# Patient Record
Sex: Female | Born: 1970 | Race: White | Hispanic: Yes | Marital: Married | Smoking: Never smoker
Health system: Southern US, Community
[De-identification: ages and names within clinical notes are randomized; demographics above are authoritative.]

## PROBLEM LIST (undated history)

## (undated) DIAGNOSIS — Z87898 Personal history of other specified conditions: Secondary | ICD-10-CM

## (undated) DIAGNOSIS — Z8742 Personal history of other diseases of the female genital tract: Secondary | ICD-10-CM

## (undated) DIAGNOSIS — B009 Herpesviral infection, unspecified: Secondary | ICD-10-CM

## (undated) HISTORY — DX: Personal history of other diseases of the female genital tract: Z87.42

## (undated) HISTORY — DX: Herpesviral infection, unspecified: B00.9

## (undated) HISTORY — DX: Personal history of other specified conditions: Z87.898

## (undated) HISTORY — PX: WISDOM TOOTH EXTRACTION: SHX21

---

## 2013-11-22 ENCOUNTER — Other Ambulatory Visit (HOSPITAL_COMMUNITY)
Admission: RE | Admit: 2013-11-22 | Discharge: 2013-11-22 | Disposition: A | Discharge status: Home / Self Care | Payer: 59 | Source: Ambulatory Visit | Attending: Family Medicine | Admitting: Family Medicine

## 2013-11-22 DIAGNOSIS — Z1151 Encounter for screening for human papillomavirus (HPV): Secondary | ICD-10-CM | POA: Diagnosis present

## 2013-11-22 DIAGNOSIS — Z124 Encounter for screening for malignant neoplasm of cervix: Secondary | ICD-10-CM | POA: Diagnosis present

## 2013-11-27 ENCOUNTER — Other Ambulatory Visit: Payer: Self-pay

## 2013-11-27 DIAGNOSIS — Z1231 Encounter for screening mammogram for malignant neoplasm of breast: Secondary | ICD-10-CM

## 2013-12-11 ENCOUNTER — Ambulatory Visit: Payer: 59

## 2013-12-12 ENCOUNTER — Encounter (INDEPENDENT_AMBULATORY_CARE_PROVIDER_SITE_OTHER): Payer: Self-pay

## 2013-12-12 ENCOUNTER — Ambulatory Visit
Admission: RE | Admit: 2013-12-12 | Discharge: 2013-12-12 | Disposition: A | Discharge status: Home / Self Care | Payer: 59 | Source: Ambulatory Visit

## 2013-12-12 DIAGNOSIS — Z1231 Encounter for screening mammogram for malignant neoplasm of breast: Secondary | ICD-10-CM

## 2013-12-19 ENCOUNTER — Encounter: Payer: Self-pay | Admitting: Obstetrics and Gynecology

## 2014-02-14 ENCOUNTER — Encounter: Payer: 59 | Admitting: Obstetrics and Gynecology

## 2014-02-15 ENCOUNTER — Encounter: Payer: 59 | Admitting: Obstetrics and Gynecology

## 2014-02-16 ENCOUNTER — Encounter: Payer: Self-pay | Admitting: Obstetrics and Gynecology

## 2014-02-16 ENCOUNTER — Ambulatory Visit (INDEPENDENT_AMBULATORY_CARE_PROVIDER_SITE_OTHER): Payer: 59 | Admitting: Obstetrics and Gynecology

## 2014-02-16 VITALS — BP 110/60 | HR 64 | Resp 18 | Ht 63.25 in | Wt 141.0 lb

## 2014-02-16 DIAGNOSIS — N760 Acute vaginitis: Secondary | ICD-10-CM

## 2014-02-16 DIAGNOSIS — Z0000 Encounter for general adult medical examination without abnormal findings: Secondary | ICD-10-CM

## 2014-02-16 LAB — POCT URINALYSIS DIPSTICK
Bilirubin, UA: NEGATIVE
Blood, UA: NEGATIVE
Glucose, UA: NEGATIVE
KETONES UA: NEGATIVE
Leukocytes, UA: NEGATIVE
Nitrite, UA: NEGATIVE
PH UA: 5
PROTEIN UA: NEGATIVE
UROBILINOGEN UA: NEGATIVE

## 2014-02-16 NOTE — Progress Notes (Signed)
43 y.o. Christine Crawford MarriedCaucasianF here for problem visit.   Patient is from [place]  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  Usually no discharge or pain.  Has itching which is progressive.  Cracks in the skin.  No odor.  Comes and goes.  Usually treated as yeast infection.  Frequently took Fluconazole but stopped due to concerns about potential liver side effects.  Encouraged to use Monistat instead.  3 day tx helps.  Used last time 2 months ago.  Husband also perceives some itching when patient has yeast infections and they have intercourse.   No new partners.   Uses Dove soap.  No douching.  Uses pads only during menses.  Does not use every day.  Uses antiallergic form.  History of oral HSV and uses Famvir.  No other medications.   Has had glucose checked and is at the limit of normal.   No regular exercise program.   Probiotics did not work.   Patient's last menstrual period was 01/29/2014.          Sexually active: Yes.    The current method of family planning is vasectomy.    Exercising: No.  The patient does not participate in regular exercise at present. Smoker:  no  Health Maintenance: Pap: 11/2013 neg - Per pt History of abnormal Pap:  no MMG:  12/12/13 BIRADS1: neg Colonoscopy:  12/2013 normal BMD:   Never TDaP:  Current - per pt Screening Labs: PCP, Hb today: PCP, Urine today:     reports that she has never smoked. She has never used smokeless tobacco. She reports that she drinks about 0.6 oz of alcohol per week. She reports that she does not use illicit drugs.  Past Medical History  Diagnosis Date  . History of vaginal infection     Recurrents     Past Surgical History  Procedure Laterality Date  . Wisdom tooth extraction      No current outpatient prescriptions on file.   No current facility-administered medications for this visit.    Family History  Problem Relation Age of Onset  .  Cancer Mother     Colon  . Heart disease Father   . Breast cancer Maternal Aunt   . Breast cancer Paternal Aunt     ROS:  Pertinent items are noted in HPI.  Otherwise, a comprehensive ROS was negative.  Exam:   BP 110/60 mmHg  Pulse 64  Resp 18  Ht 5' 3.25" (1.607 m)  Wt 141 lb (63.957 kg)  BMI 24.77 kg/m2  LMP 01/29/2014     Height: 5' 3.25" (160.7 cm)  Ht Readings from Last 3 Encounters:  02/16/14 5' 3.25" (1.607 m)    General appearance: alert, cooperative and appears stated age Abdomen: soft, non-tender; bowel sounds normal; no masses,  no organomegaly No abnormal inguinal nodes palpated Neurologic: Grossly normal  Pelvic: External genitalia:  no lesions              Urethra:  normal appearing urethra with no masses, tenderness or lesions              Bartholins and Skenes: normal                 Vagina: normal appearing vagina with normal color and discharge, no lesions              Cervix: no lesions  Pap taken: No. Bimanual Exam:  Uterus:  normal size, contour, position, consistency, mobility, non-tender              Adnexa: normal adnexa and no mass, fullness, tenderness              Assessment:     History of recurrent vaginitis.  Asymptomatic today.   Plan:    Comprehensive discussion regarding vaginitis.  Return to office for symptoms.  Discussed potential boric acid vaginal suppositories for recurrent infection.  Yeast culture may be helpful in future.  Continue probiotics and good hygiene practices with avoiding irritants and harsh soaps. Avoid restrictive clothing.   45 minutes face to face time of which over 50% was spent in counseling.   An After Visit Summary was printed and given to the patient.

## 2014-02-16 NOTE — Patient Instructions (Signed)
Please research Boric acid suppositories for the vagina.  This may be a future option for you!

## 2014-05-03 ENCOUNTER — Telehealth: Payer: Self-pay | Admitting: Obstetrics and Gynecology

## 2014-05-03 NOTE — Telephone Encounter (Signed)
Spoke with patient. Patient states that she has recurrent "vaginal infections." States she is having vaginal itching and discomfort. Requesting to be seen for evaluation. Appointment scheduled for tomorrow at 10:15am with Lauro FranklinPatricia Rolen-Grubb, FNP. Agreeable to date and time.  Routing to provider for final review. Patient agreeable to disposition. Will close encounter

## 2014-05-03 NOTE — Telephone Encounter (Signed)
Pt states she has a bacterial infection and would like to see Dr Edward JollySilva. Pt informed Dr Edward JollySilva is not in the office, pt agreeable to see NP.

## 2014-05-04 ENCOUNTER — Ambulatory Visit: Payer: Self-pay | Admitting: Nurse Practitioner

## 2014-05-07 ENCOUNTER — Encounter: Payer: Self-pay | Admitting: Certified Nurse Midwife

## 2014-05-07 ENCOUNTER — Ambulatory Visit (INDEPENDENT_AMBULATORY_CARE_PROVIDER_SITE_OTHER): Payer: 59 | Admitting: Certified Nurse Midwife

## 2014-05-07 VITALS — BP 110/60 | HR 70 | Resp 16 | Ht 63.25 in | Wt 146.0 lb

## 2014-05-07 DIAGNOSIS — N762 Acute vulvitis: Secondary | ICD-10-CM

## 2014-05-07 MED ORDER — NYSTATIN-TRIAMCINOLONE 100000-0.1 UNIT/GM-% EX CREA: 1.0000 "application " | TOPICAL_CREAM | Freq: Two times a day (BID) | CUTANEOUS | Status: DC

## 2014-05-07 NOTE — Patient Instructions (Signed)
Monilial Vaginitis Vaginitis in a soreness, swelling and redness (inflammation) of the vagina and vulva. Monilial vaginitis is not a sexually transmitted infection. CAUSES  Yeast vaginitis is caused by yeast (candida) that is normally found in your vagina. With a yeast infection, the candida has overgrown in number to a point that upsets the chemical balance. SYMPTOMS   White, thick vaginal discharge.  Swelling, itching, redness and irritation of the vagina and possibly the lips of the vagina (vulva).  Burning or painful urination.  Painful intercourse. DIAGNOSIS  Things that may contribute to monilial vaginitis are:  Postmenopausal and virginal states.  Pregnancy.  Infections.  Being tired, sick or stressed, especially if you had monilial vaginitis in the past.  Diabetes. Good control will help lower the chance.  Birth control pills.  Tight fitting garments.  Using bubble bath, feminine sprays, douches or deodorant tampons.  Taking certain medications that kill germs (antibiotics).  Sporadic recurrence can occur if you become ill. TREATMENT  Your caregiver will give you medication.  There are several kinds of anti monilial vaginal creams and suppositories specific for monilial vaginitis. For recurrent yeast infections, use a suppository or cream in the vagina 2 times a week, or as directed.  Anti-monilial or steroid cream for the itching or irritation of the vulva may also be used. Get your caregiver's permission.  Painting the vagina with methylene blue solution may help if the monilial cream does not work.  Eating yogurt may help prevent monilial vaginitis. HOME CARE INSTRUCTIONS   Finish all medication as prescribed.  Do not have sex until treatment is completed or after your caregiver tells you it is okay.  Take warm sitz baths.  Do not douche.  Do not use tampons, especially scented ones.  Wear cotton underwear.  Avoid tight pants and panty  hose.  Tell your sexual partner that you have a yeast infection. They should go to their caregiver if they have symptoms such as mild rash or itching.  Your sexual partner should be treated as well if your infection is difficult to eliminate.  Practice safer sex. Use condoms.  Some vaginal medications cause latex condoms to fail. Vaginal medications that harm condoms are:  Cleocin cream.  Butoconazole (Femstat).  Terconazole (Terazol) vaginal suppository.  Miconazole (Monistat) (may be purchased over the counter). SEEK MEDICAL CARE IF:   You have a temperature by mouth above 102 F (38.9 C).  The infection is getting worse after 2 days of treatment.  The infection is not getting better after 3 days of treatment.  You develop blisters in or around your vagina.  You develop vaginal bleeding, and it is not your menstrual period.  You have pain when you urinate.  You develop intestinal problems.  You have pain with sexual intercourse. Document Released: 01/07/2005 Document Revised: 06/22/2011 Document Reviewed: 09/21/2008 [facility] Patient Information 2015 [place], [place]. This information is not intended to replace advice given to you by your health care provider. Make sure you discuss any questions you have with your health care provider. Candida Infection A Candida infection (also called yeast, fungus, and Monilia infection) is an overgrowth of yeast that can occur anywhere on the body. A yeast infection commonly occurs in warm, moist body areas. Usually, the infection remains localized but can spread to become a systemic infection. A yeast infection may be a sign of a more severe disease such as diabetes, leukemia, or AIDS. A yeast infection can occur in both men and women. In women, Candida vaginitis  is a vaginal infection. It is one of the most common causes of vaginitis. Men usually do not have symptoms or know they have an infection until other problems develop. Men may  find out they have a yeast infection because their sex partner has a yeast infection. Uncircumcised men are more likely to get a yeast infection than circumcised men. This is because the uncircumcised glans is not exposed to air and does not remain as dry as that of a circumcised glans. Older adults may develop yeast infections around dentures. CAUSES  Women  Antibiotics.  Steroid medication taken for a long time.  Being overweight (obese).  Diabetes.  Poor immune condition.  Certain serious medical conditions.  Immune suppressive medications for organ transplant patients.  Chemotherapy.  Pregnancy.  Menstruation.  Stress and fatigue.  Intravenous drug use.  Oral contraceptives.  Wearing tight-fitting clothes in the crotch area.  Catching it from a sex partner who has a yeast infection.  Spermicide.  Intravenous, urinary, or other catheters. Men  Catching it from a sex partner who has a yeast infection.  Having oral or anal sex with a person who has the infection.  Spermicide.  Diabetes.  Antibiotics.  Poor immune system.  Medications that suppress the immune system.  Intravenous drug use.  Intravenous, urinary, or other catheters. SYMPTOMS  Women  Thick, white vaginal discharge.  Vaginal itching.  Redness and swelling in and around the vagina.  Irritation of the lips of the vagina and perineum.  Blisters on the vaginal lips and perineum.  Painful sexual intercourse.  Low blood sugar (hypoglycemia).  Painful urination.  Bladder infections.  Intestinal problems such as constipation, indigestion, bad breath, bloating, increase in gas, diarrhea, or loose stools. Men  Men may develop intestinal problems such as constipation, indigestion, bad breath, bloating, increase in gas, diarrhea, or loose stools.  Dry, cracked skin on the penis with itching or discomfort.  Jock itch.  Dry, flaky skin.  Athlete's  foot.  Hypoglycemia. DIAGNOSIS  Women  A history and an exam are performed.  The discharge may be examined under a microscope.  A culture may be taken of the discharge. Men  A history and an exam are performed.  Any discharge from the penis or areas of cracked skin will be looked at under the microscope and cultured.  Stool samples may be cultured. TREATMENT  Women  Vaginal antifungal suppositories and creams.  Medicated creams to decrease irritation and itching on the outside of the vagina.  Warm compresses to the perineal area to decrease swelling and discomfort.  Oral antifungal medications.  Medicated vaginal suppositories or cream for repeated or recurrent infections.  Wash and dry the irritation areas before applying the cream.  Eating yogurt with Lactobacillus may help with prevention and treatment.  Sometimes painting the vagina with gentian violet solution may help if creams and suppositories do not work. Men  Antifungal creams and oral antifungal medications.  Sometimes treatment must continue for 30 days after the symptoms go away to prevent recurrence. HOME CARE INSTRUCTIONS  Women  Use cotton underwear and avoid tight-fitting clothing.  Avoid colored, scented toilet paper and deodorant tampons or pads.  Do not douche.  Keep your diabetes under control.  Finish all the prescribed medications.  Keep your skin clean and dry.  Consume milk or yogurt with Lactobacillus-active culture regularly. If you get frequent yeast infections and think that is what the infection is, there are over-the-counter medications that you can get. If the infection  does not show healing in 3 days, talk to your caregiver.  Tell your sex partner you have a yeast infection. Your partner may need treatment also, especially if your infection does not clear up or recurs. Men  Keep your skin clean and dry.  Keep your diabetes under control.  Finish all prescribed  medications.  Tell your sex partner that you have a yeast infection so he or she can be treated if necessary. SEEK MEDICAL CARE IF:   Your symptoms do not clear up or worsen in one week after treatment.  You have an oral temperature above 102 F (38.9 C).  You have trouble swallowing or eating for a prolonged time.  You develop blisters on and around your vagina.  You develop vaginal bleeding and it is not your menstrual period.  You develop abdominal pain.  You develop intestinal problems as mentioned above.  You get weak or light-headed.  You have painful or increased urination.  You have pain during sexual intercourse. MAKE SURE YOU:   Understand these instructions.  Will watch your condition.  Will get help right away if you are not doing well or get worse. Document Released: 05/07/2004 Document Revised: 08/14/2013 Document Reviewed: 08/19/2009 [facility] Patient Information 2015 [place], [place]. This information is not intended to replace advice given to you by your health care provider. Make sure you discuss any questions you have with your health care provider.

## 2014-05-07 NOTE — Progress Notes (Signed)
44 y.o. married white female([place] origin) [id number] here with complaint of vaginal symptoms of itching, burning, external only of vulva area.  Describes discharge as scant, menses starting.. Onset of symptoms 30 days ago. Denies new personal products or vaginal dryness. Patient treated self with Monistat 3 and Monistat derm as directed about one month ago. Felt better, but did not go completely away. Partner having symptoms also and used some of her cream once or twice. Spouse has never really treated himself consistent and when he has symptoms she does also. Patient has dealing with this off and on for about one year. No STD concerns. Urinary symptoms none .   O:Healthy female WDWN Affect: normal, orientation x 3  Exam: Abdomen:soft. Lymph node: no enlargement or tenderness Pelvic exam: External genital: normal female with redness on lower half of vulva area and at fourchette of vagina. Slight scaling and cracking noted, with white exudate. Tender to touch wet prep taken BUS: negative Vagina: scant brown discharge noted. Ph: not done   ,Wet prep not taken. Affirm collected. 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 Discussed Aveeno or baking soda sitz bath for comfort. Avoid moist clothes or pads for extended period of time.  Patient will have Spouse treat himself with OTC or see his PCP. Encouraged to avoid sexual activity until she completes her medication. Will advise of results of Affirm when in. Discussed consider Boric acid suppositories if felt to be chronic, but feel that they are passing this back/forth and may not be chronic just unresolved. Patient also feels this may be the case. Questions addressed. Rx: Mycolog cream see order with  instructions.  Rv prn

## 2014-05-08 ENCOUNTER — Telehealth: Payer: Self-pay

## 2014-05-08 LAB — WET PREP BY MOLECULAR PROBE
CANDIDA SPECIES: POSITIVE — AB
GARDNERELLA VAGINALIS: NEGATIVE
Trichomonas vaginosis: NEGATIVE

## 2014-05-08 MED ORDER — TERCONAZOLE 0.4 % VA CREA: 1.0000 | TOPICAL_CREAM | Freq: Every day | VAGINAL | Status: DC

## 2014-05-08 NOTE — Telephone Encounter (Signed)
Patient notified of results as written by provider 

## 2014-05-08 NOTE — Telephone Encounter (Signed)
lmtcb

## 2014-05-08 NOTE — Telephone Encounter (Signed)
-----   Message from Verner Choleborah S Leonard, CNM sent at 05/08/2014  8:17 AM EST ----- Notify affirm showed positive yeast only, needs Rx Terazol 7 every hs x 7 as we discussed. Order in Let her know it takes a week to treat and another week to rebalance the ph in vagina and symptoms to resolve, Rv prn

## 2014-05-08 NOTE — Telephone Encounter (Signed)
Patient returned call

## 2014-05-08 NOTE — Progress Notes (Signed)
Reviewed personally.  M. Suzanne Clayden Withem, MD.  

## 2014-08-07 ENCOUNTER — Telehealth: Payer: Self-pay | Admitting: Obstetrics and Gynecology

## 2014-08-07 NOTE — Telephone Encounter (Signed)
Pt has a vaginal infection.

## 2014-08-07 NOTE — Telephone Encounter (Signed)
Spoke with patient. Patient states that she has been experiencing vaginal itching and slight discharge for one week. "I get these infections over and over. I told Dr.Silva about this when I came in to see her but when I had another one she was out of town and I was seen by a different provider." Patient requesting to be seen with Dr.Silva for recurring symptoms. Appointment scheduled for tomorrow at 11:30am. Patient is agreeable to date and time.  Routing to provider for final review. Patient agreeable to disposition. Will close encounter

## 2014-08-08 ENCOUNTER — Encounter: Payer: Self-pay | Admitting: Obstetrics and Gynecology

## 2014-08-08 ENCOUNTER — Ambulatory Visit (INDEPENDENT_AMBULATORY_CARE_PROVIDER_SITE_OTHER): Payer: 59 | Admitting: Obstetrics and Gynecology

## 2014-08-08 ENCOUNTER — Telehealth: Payer: Self-pay | Admitting: Obstetrics and Gynecology

## 2014-08-08 VITALS — BP 98/60 | HR 70 | Ht 63.25 in | Wt 146.2 lb

## 2014-08-08 DIAGNOSIS — N760 Acute vaginitis: Secondary | ICD-10-CM | POA: Diagnosis not present

## 2014-08-08 NOTE — Progress Notes (Signed)
Patient ID: Christine Crawford, female   DOB: [birth year], 44 y.o.   MRN: [id number] GYNECOLOGY  VISIT   HPI: 44 y.o.   Married  [place]  female   [phone] with Patient's last menstrual period was 07/23/2014 (approximate).   here for evaluation of vaginal itching and discharge.  Symptoms for 3 weeks.  Frustrated with recurrent symptoms.  Denies odor.  Has used some Terazol externally but not internally.  No recent antibiotics.   Has recurrent vaginitis.  Frequently yeast based.  Has used Fluconazole in the past but worried about long term use.   Saw PCP last year and glucose was slightly elevated.  Due for a revisit.   GYNECOLOGIC HISTORY: Patient's last menstrual period was 07/23/2014 (approximate). Contraception: Vasectomy Menopausal hormone therapy: n/a Last pap: 11/2013 wnl - per patient Last mammogram:  12-12-13 fibroglandular density/nl:[facility]        OB History    Gravida Para Term Preterm AB TAB SAB Ectopic Multiple Living   2 2 2                 There are no active problems to display for this patient.   Past Medical History  Diagnosis Date  . History of vaginal infection     Recurrents     Past Surgical History  Procedure Laterality Date  . Wisdom tooth extraction      Current Outpatient Prescriptions  Medication Sig Dispense Refill  . nystatin-triamcinolone (MYCOLOG II) cream Apply 1 application topically 2 (two) times daily. Apply to affected area BID for up to 7 days. (Patient not taking: Reported on 08/08/2014) 30 g 0   No current facility-administered medications for this visit.     ALLERGIES: Review of patient's allergies indicates no known allergies.  Family History  Problem Relation Age of Onset  . Cancer Mother     Colon  . Heart disease Father   . Breast cancer Maternal Aunt   . Breast cancer Paternal Aunt     History   Social History  . Marital Status: Married    Spouse Name: N/A  . Number of Children: N/A  . Years of Education:  N/A   Occupational History  . Not on file.   Social History Main Topics  . Smoking status: Never Smoker   . Smokeless tobacco: Never Used  . Alcohol Use: 0.6 oz/week    1 Glasses of wine per week  . Drug Use: No  . Sexual Activity:    Partners: Male     Comment: husband's vasectomy   Other Topics Concern  . Not on file   Social History Narrative    ROS:  Pertinent items are noted in HPI.  PHYSICAL EXAMINATION:    BP 98/60 mmHg  Pulse 70  Ht 5' 3.25" (1.607 m)  Wt 146 lb 3.2 oz (66.316 kg)  BMI 25.68 kg/m2  LMP 07/23/2014 (Approximate)     General appearance: alert, cooperative and appears stated age  Pelvic: External genitalia:  no lesions              Urethra:  normal appearing urethra with no masses, tenderness or lesions              Bartholins and Skenes: normal                 Vagina: normal appearing vagina with normal color and moderate white discharge, no lesions              Cervix: normal  appearance                   Bimanual Exam:  Uterus:  uterus is normal size, shape, consistency and nontender                                      Adnexa: normal adnexa in size, nontender and no masses                                       ASSESSMENT  Recurrent vaginitis.  Elevated glucose level per patient.    PLAN  Affirm taken.  Will return for fasting labs - BMP (glucose) and HgbA1C.  Discussed Hylafem and Boric acid suppositories.  Discussed atrophy of vagina that can also mimic some vaginitis symptoms.  Treatment to await results.  Return prn.   An After Visit Summary was printed and given to the patient.  __15____ minutes face to face time of which over 50% was spent in counseling.

## 2014-08-08 NOTE — Telephone Encounter (Signed)
Spoke with patient. Patient states she is unable to have lab work done before Monday. Patient is scheduled for 5/2 at 8:30am to have glucose and BMP. "I had a culture done today and I am worried I will not hear back now until Tuesday and we talking about boric acid or hylafem. Can I get this before the weekend without having my labs until Monday?" Advised patient culture should return before the weekend and Dr.Silva will be able to make further recommendations based upon results before the weekend for relief. Then after lab work is performed on Monday will be contacted regarding those results and further recommendations. Patient is agreeable.   Routing to provider for final review. Patient agreeable to disposition. Will close encounter

## 2014-08-08 NOTE — Telephone Encounter (Signed)
Patient forgot to ask Dr.Silva a question at her appointment today. No further details given.

## 2014-08-09 LAB — WET PREP BY MOLECULAR PROBE
Candida species: POSITIVE — AB
GARDNERELLA VAGINALIS: NEGATIVE
TRICHOMONAS VAG: NEGATIVE

## 2014-08-10 ENCOUNTER — Telehealth: Payer: Self-pay

## 2014-08-10 MED ORDER — TERCONAZOLE 0.4 % VA CREA: 1.0000 | TOPICAL_CREAM | Freq: Every day | VAGINAL | Status: DC

## 2014-08-10 MED ORDER — HYLAFEM VA SUPP: 1.0000 | Freq: Every day | VAGINAL | Status: DC

## 2014-08-10 MED ORDER — HYLAFEM VA SUPP
1.0000 | Freq: Every day | VAGINAL | Status: DC
Start: 2014-08-10 — End: 2016-02-07

## 2014-08-10 NOTE — Telephone Encounter (Signed)
Left message to call Christine Crawford at [phone]. 

## 2014-08-10 NOTE — Telephone Encounter (Signed)
-----   Message from Patton SallesBrook E Amundson C Silva, MD sent at 08/09/2014 10:45 AM EDT ----- Please inform patient of test results showing Candida.  This is a chronic problem for the patient.  I recommend that she do an acute treatment with Terazol 7 per vagina at this time.  Please send to her pharmacy of choice.  I will also prescribe the Hylafem 6 night treatment for the patient to have on hand.   Order this through [facility].  They will apparently have to order this as it is not always on the shelf. Treatment can be for only 3 nights in the future.   Patient is having her blood sugar checked here this week as she told me at there visit that her PCP said that her glucose was high.  A diagnosis of prediabetes or diabetes may be an explanation for the recurrence of these infections for her.   I would recommend that her partner do a treatment for yeast as well if he is having any itching or burning of the genital area. Sometimes couples share the yeast and pass it back and forth.   Cc- Claudette LawsAmanda Dixon

## 2014-08-10 NOTE — Telephone Encounter (Signed)
Spoke with patient. Advised of results and message as seen below from Dr.Silva. Patient is agreeable and verbalizes understanding. Rx for Terazol 7 0RF and Hylafem #6 0 RF [facility]. Patient is agreeable.  Routing to provider for final review. Patient agreeable to disposition. Will close encounter

## 2014-08-10 NOTE — Telephone Encounter (Signed)
Returning call.

## 2014-08-13 ENCOUNTER — Other Ambulatory Visit (INDEPENDENT_AMBULATORY_CARE_PROVIDER_SITE_OTHER): Payer: 59

## 2014-08-13 DIAGNOSIS — N760 Acute vaginitis: Secondary | ICD-10-CM

## 2014-08-13 LAB — BASIC METABOLIC PANEL
BUN: 17 mg/dL (ref 6–23)
CALCIUM: 9 mg/dL (ref 8.4–10.5)
CHLORIDE: 109 meq/L (ref 96–112)
CO2: 27 meq/L (ref 19–32)
CREATININE: 0.84 mg/dL (ref 0.50–1.10)
GLUCOSE: 92 mg/dL (ref 70–99)
POTASSIUM: 4.6 meq/L (ref 3.5–5.3)
SODIUM: 140 meq/L (ref 135–145)

## 2014-08-13 LAB — HEMOGLOBIN A1C
HEMOGLOBIN A1C: 5.5 % (ref <5.7)
Mean Plasma Glucose: 111 mg/dL (ref <117)

## 2014-08-14 ENCOUNTER — Telehealth: Payer: Self-pay | Admitting: *Deleted

## 2014-08-14 NOTE — Telephone Encounter (Signed)
Returning call.

## 2014-08-14 NOTE — Telephone Encounter (Signed)
Patient notified see result note 

## 2014-08-14 NOTE — Telephone Encounter (Signed)
Left Message To Call Back  

## 2014-08-14 NOTE — Telephone Encounter (Signed)
-----   Message from Patton SallesBrook E Amundson C Silva, MD sent at 08/14/2014  1:01 PM EDT ----- Please report normal blood chemistries, glucose, and hemoglobin A1C to patient.  She is not diabetic or prediabetic.   I hope the Hylafem has worked well for her.  It is nice in that it is a 3 - 6 day treatment option.   If the patient is preferring to return to periodic Diflucan, I can prescribe for her to do this one pill Diflucan 150 mg monthly.  I know she has had some concerns about the long term use of this.   Cc- Claudette LawsAmanda Dixon

## 2014-12-12 ENCOUNTER — Telehealth: Payer: Self-pay | Admitting: Obstetrics and Gynecology

## 2014-12-12 NOTE — Telephone Encounter (Signed)
I would have her consider over the counter antifungal treatment with Monistat or Gynelotrimin.  I would do a 3 day intravaginal treatment.  Avoid wet bathing suits, work out clothes, etc.

## 2014-12-12 NOTE — Telephone Encounter (Signed)
Patient would like to speak with nurse and give an update after taking medication.

## 2014-12-12 NOTE — Telephone Encounter (Signed)
Spoke with patient. Patient was last seen on 08/08/2014 with Dr.Silva for recurrent vaginitis. Patient was given Hylafem #6, Terzol 7, and Mycolog II. Patient's wet prep returned showing yeast at that appointment. Was advised to use Mycolog II and Terazol 7 and save Hylafem for future symptoms. Patient states that she began to have vaginal burning and itching with discharge last week. Patient used Hylafem 8/26-8/28. Patient states she is still experiencing the same symptoms and does not feel any better. Patient is asking if it is okay for her to use 3 more days of the Hylafem or if she needs something else for treatment. Advised I will speak with Dr.Silva and return call with further recommendations.

## 2014-12-12 NOTE — Telephone Encounter (Signed)
Spoke with patient. Advised of message as seen below from Dr.Silva. Patient is agreeable and verbalizes understanding. Will return call if symptoms persist after treatment.  Routing to provider for final review. Patient agreeable to disposition. Will close encounter.

## 2015-09-17 ENCOUNTER — Other Ambulatory Visit: Payer: Self-pay | Admitting: Family Medicine

## 2015-09-17 ENCOUNTER — Other Ambulatory Visit: Payer: Self-pay

## 2015-09-17 DIAGNOSIS — Z1231 Encounter for screening mammogram for malignant neoplasm of breast: Secondary | ICD-10-CM

## 2015-10-01 ENCOUNTER — Ambulatory Visit
Admission: RE | Admit: 2015-10-01 | Discharge: 2015-10-01 | Disposition: A | Discharge status: Home / Self Care | Payer: 59 | Source: Ambulatory Visit | Attending: Family Medicine | Admitting: Family Medicine

## 2015-10-01 DIAGNOSIS — Z1231 Encounter for screening mammogram for malignant neoplasm of breast: Secondary | ICD-10-CM

## 2016-02-05 ENCOUNTER — Telehealth: Payer: Self-pay | Admitting: Obstetrics and Gynecology

## 2016-02-05 NOTE — Telephone Encounter (Signed)
Spoke with patient at time of incoming call. Patient states that she has recurring vaginal yeast infections and was advised by Dr.Silva she may use OTC Monistat for treatment. Reports she had vaginal itching and discharge last month. Used OTC Monistat 3 and had slight burning. Symptom relief after completing 3 day course. Yesterday she began to have vaginal itching and discharge. Used 1 dose of Monistat 3 and began to have moderate vaginal burning. Had to try to flush medication out of vaginal area. Today she is having slight burning, itching, and discharge. Advised she will need to be seen in the office for further evaluation. Offered appointment for tomorrow with Dr.Silva, but patient declines. Appointment scheduled for 02/07/2016 at 1 pm with Dr.Silva. Patient is agreeable to date and time. Aware if she develops any new symptoms or symptoms worsen she will need to be seen earlier with our office or urgent care for evaluation. Patient is agreeable.  Routing to provider for final review. Patient agreeable to disposition. Will close encounter.

## 2016-02-07 ENCOUNTER — Ambulatory Visit (INDEPENDENT_AMBULATORY_CARE_PROVIDER_SITE_OTHER): Payer: 59 | Admitting: Obstetrics and Gynecology

## 2016-02-07 ENCOUNTER — Encounter: Payer: Self-pay | Admitting: Obstetrics and Gynecology

## 2016-02-07 VITALS — BP 110/70 | HR 64 | Ht 63.25 in

## 2016-02-07 DIAGNOSIS — N760 Acute vaginitis: Secondary | ICD-10-CM

## 2016-02-07 MED ORDER — NYSTATIN-TRIAMCINOLONE 100000-0.1 UNIT/GM-% EX CREA: 1.0000 "application " | TOPICAL_CREAM | Freq: Two times a day (BID) | CUTANEOUS | 0 refills | Status: DC

## 2016-02-07 MED ORDER — FLUCONAZOLE 150 MG PO TABS: 150.0000 mg | ORAL_TABLET | Freq: Once | ORAL | 2 refills | Status: AC

## 2016-02-07 NOTE — Progress Notes (Signed)
GYNECOLOGY  VISIT   HPI: 45 y.o.   Married  Caucasian/Brazilian female   G2P2000 with Patient's last menstrual period was 01/12/2016 (approximate).   here for vaginal itching. Used Monistat recently and it worsened symptoms. She developed extreme vaginal burning.    Initial symptoms are itching on the vulva.  Not really having discharge.   Used Monistat for 2 days last month. Caused burning immediately.   5 days ago had recurrent itching and burning.  Used Monistat again, and the burning recurred.  Has recurrent vaginitis.  No recent abx.  No regular use of pads.  Tried probiotics and this did not completely resolve her symptoms.   Also had burning with Hylafem in the past.   Hx elevated glucose but no diabetes.  GYNECOLOGIC HISTORY: Patient's last menstrual period was 01/12/2016 (approximate). Contraception:  Vasectomy Menopausal hormone therapy:  n/a Last mammogram: 10-01-15 Density[facility]/Neg/BiRads1:[facility] Last pap smear:  11/2013 normal per patient        OB History    Gravida Para Term Preterm AB Living   2 2 2          SAB TAB Ectopic Multiple Live Births                     There are no active problems to display for this patient.   Past Medical History:  Diagnosis Date  . History of vaginal infection    Recurrents     Past Surgical History:  Procedure Laterality Date  . WISDOM TOOTH EXTRACTION      Current Outpatient Prescriptions  Medication Sig Dispense Refill  . ferrous sulfate 325 (65 FE) MG EC tablet Take 325 mg by mouth as needed.    . valACYclovir (VALTREX) 500 MG tablet Take 500 mg by mouth as needed.     No current facility-administered medications for this visit.      ALLERGIES: Review of patient's allergies indicates no known allergies.  Family History  Problem Relation Age of Onset  . Cancer Mother     Colon  . Heart disease Father   . Breast cancer Maternal Aunt   . Breast cancer Paternal Aunt     Social History    Social History  . Marital status: Married    Spouse name: N/A  . Number of children: N/A  . Years of education: N/A   Occupational History  . Not on file.   Social History Main Topics  . Smoking status: Never Smoker  . Smokeless tobacco: Never Used  . Alcohol use 0.6 oz/week    1 Glasses of wine per week  . Drug use: No  . Sexual activity: Yes    Partners: Male     Comment: husband's vasectomy   Other Topics Concern  . Not on file   Social History Narrative  . No narrative on file    ROS:  Pertinent items are noted in HPI.  PHYSICAL EXAMINATION:    BP 110/70 (BP Location: Right Arm, Patient Position: Sitting, Cuff Size: Normal)   Pulse 64   Ht 5' 3.25" (1.607 m)   LMP 01/12/2016 (Approximate)     General appearance: alert, cooperative and appears stated age    Pelvic: External genitalia:   Labia minora very red bilaterally, no lesions.               Urethra:  normal appearing urethra with no masses, tenderness or lesions  Bartholins and Skenes: normal                 Vagina: normal appearing vagina with normal color and discharge, no lesions              Cervix: no lesions                Bimanual Exam:  Uterus:  normal size, contour, position, consistency, mobility, non-tender              Adnexa: no mass, fullness, tenderness         Chaperone was present for exam.  ASSESSMENT  Recurrent vulvovaginitis.   PLAN  Affirm testing done. Detailed discussion of vaginitis and tx of yeast infection.  Diflucan Rx with refills.  Discussed risks and benefits of Dillfucan. Mycolog II.  Return for annual in 4 weeks.    An After Visit Summary was printed and given to the patient.  __25____ minutes face to face time of which over 50% was spent in counseling.

## 2016-02-08 LAB — WET PREP BY MOLECULAR PROBE
CANDIDA SPECIES: NEGATIVE
Gardnerella vaginalis: POSITIVE — AB
Trichomonas vaginosis: NEGATIVE

## 2016-02-09 MED ORDER — METRONIDAZOLE 500 MG PO TABS: 500.0000 mg | ORAL_TABLET | Freq: Two times a day (BID) | ORAL | 0 refills | Status: DC

## 2016-02-09 NOTE — Addendum Note (Signed)
Addended by: Ardell IsaacsAMUNDSON C SILVA, Debbe BalesBROOK E on: 02/09/2016 04:39 PM   Modules accepted: Orders

## 2016-04-01 ENCOUNTER — Encounter: Payer: Self-pay | Admitting: Obstetrics and Gynecology

## 2016-04-01 ENCOUNTER — Telehealth: Payer: Self-pay | Admitting: Obstetrics and Gynecology

## 2016-04-01 ENCOUNTER — Ambulatory Visit (INDEPENDENT_AMBULATORY_CARE_PROVIDER_SITE_OTHER): Payer: 59 | Admitting: Obstetrics and Gynecology

## 2016-04-01 VITALS — BP 100/74 | HR 60 | Resp 16 | Ht 64.0 in | Wt 149.4 lb

## 2016-04-01 DIAGNOSIS — Z01419 Encounter for gynecological examination (general) (routine) without abnormal findings: Secondary | ICD-10-CM | POA: Diagnosis not present

## 2016-04-01 NOTE — Patient Instructions (Signed)

## 2016-04-01 NOTE — Progress Notes (Signed)
45 y.o. 552P2000 Married Caucasian female here for annual exam.    Menses are irregular. 22 - 30 days apart.  Can be light or heavy.  No pain.  Has chronic vaginitis.  Treats periodically.  Not taking probiotics.   Wants pap smear. States that cannot do with [facility].  Does not need refill of Valtrex.  Labs with PCP.  All normal.   PCP:  Beverley FiedlerVictoria Rankins, MD  LMP 03/27/16         Sexually active: Yes.   female The current method of family planning is vasectomy.    Exercising: No.   Smoker:  no  Health Maintenance: Pap:  11/2013 - neg History of abnormal Pap:  no MMG:   [facility] Colonoscopy:  12/2013 - normal. BMD:    Never   TDaP:  Current   Screening Labs:   Urine today:  Unable to go.   reports that she has never smoked. She has never used smokeless tobacco. She reports that she drinks about 0.6 oz of alcohol per week . She reports that she does not use drugs.  Past Medical History:  Diagnosis Date  . History of vaginal infection    Recurrents     Past Surgical History:  Procedure Laterality Date  . WISDOM TOOTH EXTRACTION      Current Outpatient Prescriptions  Medication Sig Dispense Refill  . ferrous sulfate 325 (65 FE) MG EC tablet Take 325 mg by mouth as needed.    . metroNIDAZOLE (FLAGYL) 500 MG tablet Take 1 tablet (500 mg total) by mouth 2 (two) times daily. 14 tablet 0  . nystatin-triamcinolone (MYCOLOG II) cream Apply 1 application topically 2 (two) times daily. Apply to affected area BID for up to 7 days. 60 g 0  . valACYclovir (VALTREX) 500 MG tablet Take 500 mg by mouth as needed.     No current facility-administered medications for this visit.     Family History  Problem Relation Age of Onset  . Cancer Mother     Colon  . Heart disease Father   . Breast cancer Maternal Aunt   . Breast cancer Paternal Aunt     ROS:  Pertinent items are noted in HPI.  Otherwise, a comprehensive ROS was negative.  Exam:   There  were no vitals taken for this visit.    General appearance: alert, cooperative and appears stated age Head: Normocephalic, without obvious abnormality, atraumatic Neck: no adenopathy, supple, symmetrical, trachea midline and thyroid normal to inspection and palpation Lungs: clear to auscultation bilaterally Breasts: normal appearance, no masses or tenderness, No nipple retraction or dimpling, No nipple discharge or bleeding, No axillary or supraclavicular adenopathy Heart: regular rate and rhythm Abdomen: soft, non-tender; no masses, no organomegaly Extremities: extremities normal, atraumatic, no cyanosis or edema Skin: Skin color, texture, turgor normal. No rashes or lesions Lymph nodes: Cervical, supraclavicular, and axillary nodes normal. No abnormal inguinal nodes palpated Neurologic: Grossly normal  Pelvic: External genitalia:  no lesions              Urethra:  normal appearing urethra with no masses, tenderness or lesions              Bartholins and Skenes: normal                 Vagina: normal appearing vagina with normal color and discharge, no lesions              Cervix: no lesions  Pap taken: Yes.   Bimanual Exam:  Uterus:  normal size, contour, position, consistency, mobility, non-tender              Adnexa: no mass, fullness, tenderness              Rectal exam: Yes.  .  Confirms.              Anus:  normal sphincter tone, no lesions  Chaperone was present for exam.  Assessment:   Well woman visit with normal exam. Hx chronic vulvovaginitis.  Plan: Mammogram discussed. Recommended self breast awareness. Pap and HR HPV as above. Discussed Calcium, Vitamin D, regular exercise program including cardiovascular and weight bearing exercise. Pap and HR HPV done.  Have not sent it as patient needs to check the labs she can use.  Labs with PCP.  We discussed probiotics and how this can reduce the frequency of yeast infections.  Follow up annually and prn.      Addendum - patient called office back and indicated [facility]  After visit summary provided.

## 2016-04-01 NOTE — Telephone Encounter (Signed)
Patient said okay to send her pap out. Patient said that she ask for the pap to be held until she called.

## 2016-04-08 LAB — IPS PAP TEST WITH HPV

## 2016-05-21 ENCOUNTER — Telehealth: Payer: Self-pay | Admitting: Obstetrics and Gynecology

## 2016-05-21 NOTE — Telephone Encounter (Signed)
Please inform patient that I received a note from her insurance company about interaction between Diflucan and Xanax.  The Diflucan can increase the effect of the Xanax and cause increased sedation.  Please confirm her dosage and use of Xanax so we can add this to her medication list.

## 2016-05-22 NOTE — Telephone Encounter (Signed)
Left message to call Mamie Hundertmark at [phone]. 

## 2016-05-22 NOTE — Telephone Encounter (Signed)
Patient returning call.

## 2016-05-22 NOTE — Telephone Encounter (Signed)
Spoke with patient. Patient states that Xanax was written for 4 tablets to bring with her to a laser vein procedure that will be done today 05/22/2016. States she had to fill the prescription to take to her appointment. Will not be using this long term. Will only take once for procedure. Advised I will let Dr.Silva know.  Routing to provider for final review. Patient agreeable to disposition. Will close encounter.

## 2016-05-25 ENCOUNTER — Telehealth: Payer: Self-pay | Admitting: *Deleted

## 2016-05-25 NOTE — Telephone Encounter (Signed)
Opened in error, will close encounter

## 2016-10-05 ENCOUNTER — Other Ambulatory Visit: Payer: Self-pay | Admitting: Family Medicine

## 2016-10-05 DIAGNOSIS — Z1231 Encounter for screening mammogram for malignant neoplasm of breast: Secondary | ICD-10-CM

## 2016-10-08 ENCOUNTER — Ambulatory Visit
Admission: RE | Admit: 2016-10-08 | Discharge: 2016-10-08 | Disposition: A | Discharge status: Home / Self Care | Payer: 59 | Source: Ambulatory Visit | Attending: Family Medicine | Admitting: Family Medicine

## 2016-10-08 DIAGNOSIS — Z1231 Encounter for screening mammogram for malignant neoplasm of breast: Secondary | ICD-10-CM

## 2017-03-01 ENCOUNTER — Other Ambulatory Visit: Payer: Self-pay | Admitting: Obstetrics and Gynecology

## 2017-03-11 ENCOUNTER — Telehealth: Payer: Self-pay | Admitting: Obstetrics and Gynecology

## 2017-03-11 NOTE — Telephone Encounter (Signed)
Patient says prescription was denied because she need an appointment. She is scheduled for 04/15/17 with Dr Edward JollySilva.

## 2017-03-12 ENCOUNTER — Telehealth: Payer: Self-pay | Admitting: Obstetrics and Gynecology

## 2017-03-12 MED ORDER — FLUCONAZOLE 150 MG PO TABS: 150.0000 mg | ORAL_TABLET | Freq: Every day | ORAL | 0 refills | Status: DC

## 2017-03-12 NOTE — Telephone Encounter (Signed)
Ok to treat with diflucan 150mg  po x 1, repeat 72 hours.  #2/0RF

## 2017-03-12 NOTE — Telephone Encounter (Signed)
Rx for Diflucan 150 mg po x 1, repeat in 72 hours prn #2 0RF sent to pharmacy on file. Patient has been notified. Encounter closed.

## 2017-03-12 NOTE — Telephone Encounter (Signed)
Patient has a history of chronic vulvovaginitis. Patient treats this periodically as needed per last aex on 04/01/2016. Requesting a refill of Diflucan for recurring yeast infections. Patient is scheduled for an annual exam on 04/15/2017 with Dr.Silva.  Dr.Miller, okay to send in rx for Diflucan for patient?

## 2017-03-12 NOTE — Telephone Encounter (Signed)
° °  See previous note from 03/11/17. Closed in error.  Patient is calling again about her prescription that was denied. Patient states that she has an aex scheduled 04/15/17. Patient states she needs Fluconazole 150 MG

## 2017-03-12 NOTE — Telephone Encounter (Signed)
Patient is calling again about her prescription that was denied. Patient states that she has an aex scheduled 04/15/17. Patient states she needs Fluconazole 150 MG.

## 2017-04-15 ENCOUNTER — Ambulatory Visit: Payer: 59 | Admitting: Obstetrics and Gynecology

## 2017-04-16 ENCOUNTER — Ambulatory Visit: Payer: 59 | Admitting: Obstetrics and Gynecology

## 2017-05-06 ENCOUNTER — Other Ambulatory Visit: Payer: Self-pay | Admitting: Obstetrics and Gynecology

## 2017-05-07 NOTE — Telephone Encounter (Signed)
Can you please call and speak with the patient about her request for Nystatin, does she mean nystatin-triamcinolone? Is she having an acute issue she should be seen for? How often is she using the cream?

## 2017-05-07 NOTE — Telephone Encounter (Signed)
Left message to call Adonis Yim at [phone].  

## 2017-05-07 NOTE — Telephone Encounter (Signed)
Reviewed with Dr. Reyne DumasJerston -will send RX for Mycolog II to continue through weekend, OV recommended with Dr. Edward JollySilva.   Call returned to patient, advised RX for Mycolog II to pharmacy, not to be used long term, reviewed instructions. Kepp OV scheduled with Dr. Edward JollySilva. Patient verbalizes understanding and is agreeable.   Will close encounter.

## 2017-05-07 NOTE — Telephone Encounter (Signed)
Medication refill request: nystatin  Last AEX:  04/01/16 BS  Next AEX: 05/19/17  Last MMG (if hormonal medication request): 10/08/16 BIRADS 1 negative  Refill authorized: 02/07/16 #60g, 0RF. Today, please advise.

## 2017-05-07 NOTE — Telephone Encounter (Signed)
Spoke with patient. Requesting refill of Mycolog II cream. Patient states she has recurrent vaginal infections, uses cream once a month.   Reports vaginal itching with white/yellow vaginal d/c. This d/c has increased over the last 2 -3 months. Started using cream again 2 days ago and ran out.   Patient has one diflucan from previous RX, would like to know if ok to take this or continue Mycolog cream?   Recommended OV with Dr. Edward JollySilva for further evaluation, OV scheduled for 1/30 at 2:30pm with Dr. Edward JollySilva. Advised will review with covering provider regarding RX and return call, patient is agreeable.   Dr. Oscar LaJertson -please advise on refill of Mycolog II or diflucan until OV?

## 2017-05-07 NOTE — Telephone Encounter (Signed)
Patient returning call.

## 2017-05-11 ENCOUNTER — Telehealth: Payer: Self-pay | Admitting: Obstetrics and Gynecology

## 2017-05-11 NOTE — Telephone Encounter (Signed)
Patient cancelled her problem visit appointment for Wednesday. She will wait until her aex appointment on 05/19/17.

## 2017-05-12 ENCOUNTER — Ambulatory Visit: Payer: Self-pay | Admitting: Obstetrics and Gynecology

## 2017-05-12 NOTE — Telephone Encounter (Signed)
Encounter closed by me. 

## 2017-05-19 ENCOUNTER — Other Ambulatory Visit: Payer: Self-pay

## 2017-05-19 ENCOUNTER — Encounter: Payer: Self-pay | Admitting: Obstetrics and Gynecology

## 2017-05-19 ENCOUNTER — Ambulatory Visit (INDEPENDENT_AMBULATORY_CARE_PROVIDER_SITE_OTHER): Payer: BC Managed Care – PPO | Admitting: Obstetrics and Gynecology

## 2017-05-19 VITALS — BP 110/70 | HR 72 | Resp 16 | Ht 64.0 in | Wt 141.0 lb

## 2017-05-19 DIAGNOSIS — Z01419 Encounter for gynecological examination (general) (routine) without abnormal findings: Secondary | ICD-10-CM

## 2017-05-19 DIAGNOSIS — N761 Subacute and chronic vaginitis: Secondary | ICD-10-CM

## 2017-05-19 NOTE — Patient Instructions (Signed)

## 2017-05-19 NOTE — Progress Notes (Signed)
47 y.o. 192P2000 Married [place] female here for annual exam.    Still with vaginal infection chronically.  Has itching externally, more generally on the vulva. Gets better for a short period of time with treatment and then recurs again.  Symptoms improve right after her cycle.  No hot flashes.  Diflucan works well.  She wants to limit the use of this.   Uses Dove. Cotton underwear. Uses a liner due to discharge.   Probiotics do not help.   Menses irregular.  LMP was 04/28/17 and then 05/14/17.  First time it was close together.   Has oral herpes.  No known genital herpes.  Labs with PCP.   PCP:   Dr. Rosario Adieankings   Patient's last menstrual period was 05/14/2017.           Sexually active: Yes.    The current method of family planning is vasectomy.    Exercising: No.  The patient does not participate in regular exercise at present. Smoker:  no  Health Maintenance: Pap: 04/02/16 Pap and HR HPV negative  History of abnormal Pap:  no MMG:  10/08/16 BIRADS 1 negative/density b Colonoscopy:  12/2013 - normal TDaP:  Unsure.   HIV: donates blood Hep C: donates blood Screening Labs: discuss today   reports that  has never smoked. she has never used smokeless tobacco. She reports that she drinks about 0.6 oz of alcohol per week. She reports that she does not use drugs.  Past Medical History:  Diagnosis Date  . History of vaginal infection    Recurrents   . HSV-1 infection     Past Surgical History:  Procedure Laterality Date  . WISDOM TOOTH EXTRACTION      Current Outpatient Medications  Medication Sig Dispense Refill  . ferrous sulfate 325 (65 FE) MG EC tablet Take 325 mg by mouth as needed.    . fluconazole (DIFLUCAN) 150 MG tablet Take 1 tablet (150 mg total) by mouth daily. Repeat in 72 hours if symptoms persist. 2 tablet 0  . nystatin-triamcinolone (MYCOLOG II) cream USE 1 APPLICATION OF CREAM TOPICALLY TWICE DAILY FOR UP TO 7 DAYS. 15 g 0  . valACYclovir (VALTREX)  500 MG tablet Take 500 mg by mouth as needed.     No current facility-administered medications for this visit.     Family History  Problem Relation Age of Onset  . Cancer Mother        Colon  . Heart disease Father   . Breast cancer Maternal Aunt   . Breast cancer Paternal Aunt     ROS:  Pertinent items are noted in HPI.  Otherwise, a comprehensive ROS was negative.  Exam:   BP 110/70 (BP Location: Right Arm, Patient Position: Sitting, Cuff Size: Normal)   Pulse 72   Resp 16   Ht 5\' 4"  (1.626 m)   Wt 141 lb (64 kg)   LMP 05/14/2017   BMI 24.20 kg/m     General appearance: alert, cooperative and appears stated age Head: Normocephalic, without obvious abnormality, atraumatic Neck: no adenopathy, supple, symmetrical, trachea midline and thyroid normal to inspection and palpation Lungs: clear to auscultation bilaterally Breasts: normal appearance, no masses or tenderness, No nipple retraction or dimpling, No nipple discharge or bleeding, No axillary or supraclavicular adenopathy Heart: regular rate and rhythm Abdomen: soft, non-tender; no masses, no organomegaly Extremities: extremities normal, atraumatic, no cyanosis or edema Skin: Skin color, texture, turgor normal. No rashes or lesions Lymph nodes: Cervical, supraclavicular, and axillary  nodes normal. No abnormal inguinal nodes palpated Neurologic: Grossly normal  Pelvic: External genitalia:   Significant erythema of vulva.               Urethra:  normal appearing urethra with no masses, tenderness or lesions              Bartholins and Skenes: normal                 Vagina: normal appearing vagina with normal color and discharge, no lesions              Cervix: no lesions              Pap taken: No. Bimanual Exam:  Uterus:  normal size, contour, position, consistency, mobility, non-tender              Adnexa: no mass, fullness, tenderness              Rectal exam: Yes.  .  Confirms.              Anus:  normal sphincter  tone, no lesions  Chaperone was present for exam.  Assessment:   Well woman visit with normal exam. Hx chronic vulvovaginitis.  Hx oral HSV. FH colon and breast cancer.    Plan: Mammogram screening discussed. Recommended self breast awareness. Pap and HR HPV as above. Guidelines for Calcium, Vitamin D, regular exercise program including cardiovascular and weight bearing exercise. Affirm done.  Return for vulvar biopsy.  Follow up annually and prn.      After visit summary provided.

## 2017-05-20 LAB — VAGINITIS/VAGINOSIS, DNA PROBE
Candida Species: POSITIVE — AB
Gardnerella vaginalis: POSITIVE — AB
TRICHOMONAS VAG: NEGATIVE

## 2017-05-21 ENCOUNTER — Telehealth: Payer: Self-pay | Admitting: *Deleted

## 2017-05-21 MED ORDER — METRONIDAZOLE 500 MG PO TABS: 500.0000 mg | ORAL_TABLET | Freq: Two times a day (BID) | ORAL | 0 refills | Status: DC

## 2017-05-21 MED ORDER — FLUCONAZOLE 150 MG PO TABS: 150.0000 mg | ORAL_TABLET | Freq: Every day | ORAL | 0 refills | Status: DC

## 2017-05-21 NOTE — Telephone Encounter (Signed)
-----   Message from Patton SallesBrook E Amundson C Silva, MD sent at 05/20/2017  4:25 PM EST ----- Please inform patient of vaginitis testing showing bacterial vaginosis and yeast.   She may treat with Flagyl 500 mg po bid for 7 days or Metrogel pv at hs for 5 nights. ETOH precautions.   I am recommending Diflucan 150 mg po x 1.  May repeat in 72 hours prn. Dispense 2, RF none.   We are planning to do a vulvar biopsy which should be in precert.

## 2017-05-21 NOTE — Telephone Encounter (Signed)
Notes recorded by Leda MinHamm, Threasa Kinch N, RN on 05/21/2017 at 1:18 PM EST Left message to call Noreene LarssonJill at [phone].

## 2017-05-21 NOTE — Telephone Encounter (Signed)
Spoke with patient, advised as seen below per Dr. Edward JollySilva, RX for Flagyl PO and diflucan to [facility]. ETOH precautions reviewed. Patient verbalizes understanding and is agreeable.   Will close encounter.

## 2017-05-25 ENCOUNTER — Telehealth: Payer: Self-pay | Admitting: Obstetrics and Gynecology

## 2017-05-25 NOTE — Telephone Encounter (Signed)
LMOM to convey benefits. °

## 2017-05-27 NOTE — Telephone Encounter (Signed)
Call transferred from [place]. Patient has additional questions regarding vulvar biopsy scheduling. Patient will complete Flagyl on 2/15, asking if biopsy still recommended?   Advised patient vulvar biopsy is recommended for chronic vulvovaginitis. Patient request afternoon appt. Procedure scheduled for 06/04/17 at 3:30pm with Dr. Edward JollySilva. Patient verbalizes understanding and is agreeable.   Routing to [employer] [place].  Cc: Dr. Edward JollySilva

## 2017-05-27 NOTE — Telephone Encounter (Signed)
Thank you for the update!

## 2017-06-01 NOTE — Telephone Encounter (Signed)
Will close encounter

## 2017-06-01 NOTE — Telephone Encounter (Signed)
Rosa, did you review benefits with patient on 05/27/17?

## 2017-06-01 NOTE — Telephone Encounter (Signed)
Yes, I have reviewed the benefits with the patient.  [employer]

## 2017-06-04 ENCOUNTER — Ambulatory Visit (INDEPENDENT_AMBULATORY_CARE_PROVIDER_SITE_OTHER): Payer: BC Managed Care – PPO | Admitting: Obstetrics and Gynecology

## 2017-06-04 ENCOUNTER — Other Ambulatory Visit: Payer: Self-pay

## 2017-06-04 ENCOUNTER — Encounter: Payer: Self-pay | Admitting: Obstetrics and Gynecology

## 2017-06-04 DIAGNOSIS — N761 Subacute and chronic vaginitis: Secondary | ICD-10-CM

## 2017-06-04 NOTE — Patient Instructions (Signed)
Vulva Biopsy, Care After  Refer to this sheet in the next few weeks. These instructions provide you with information about caring for yourself after your procedure. Your health care provider may also give you more specific instructions. Your treatment has been planned according to current medical practices, but problems sometimes occur. Call your health care provider if you have any problems or questions after your procedure.  What can I expect after the procedure?  After the procedure, it is common to have:   Slight bleeding from the biopsy site.   Discomfort at the biopsy site.    Follow these instructions at home:  Biopsy Site Care     Do not rub the biopsy area after urinating. Gently pat the area dry or use a bottle filled with warm water (peri-bottle) to clean the area. Gently wipe from front to back.   Follow instructions from your health care provider about how to take care of your biopsy site. Make sure you:  ? Clean the area using water and mild soap twice a day or as told by your health care provider. Gently pat the area dry.  ? If you were prescribed an antibiotic medical ointment, apply it as told by your health care provider. Do not stop using the antibiotic even if your condition improves.  ? Take a warm water bath that is taken while you are sitting down (sitz bath) as needed to help with pain or discomfort.  ? Leave stitches (sutures), skin glue, or adhesive strips in place. These skin closures may need to stay in place for 2 weeks or longer. If adhesive strip edges start to loosen and curl up, you may trim the loose edges. Do not remove adhesive strips completely unless your health care provider tells you to do that.   Check your biopsy site every day for signs of infection. Check for:  ? More redness, swelling, or pain.  ? More fluid or blood.  ? Warmth.  ? Pus or a bad smell.  Lifestyle   Wear loose, cotton underwear. Do not wear tight pants.   Do not use a tampon, douche, or put anything  inside your vagina for at least one week or until your health care provider approves.   Do not have sex for at least one week or until your health care provider approves.   Do not exercise, such as running or biking, until your health care provider approves.   Do not take baths, swim, or use a hot tub until your health care provider approves.  General instructions   Take over-the-counter and prescription medicines only as told by your health care provider.   Use a sanitary napkin until bleeding stops.   Keep all follow-up visits as told by your health care provider. This is important.   If the sample is being sent for testing, it is your responsibility to get the results of your procedure. Ask your health care provider or the department performing the procedure when your results will be ready.  Contact a health care provider if:   You have more redness, swelling, or pain around your biopsy site.   You have more fluid or blood coming from your biopsy site.   Your biopsy site feels warm to the touch.   Your pain is not controlled with medicine.  Get help right away if:   You have heavy bleeding from the vulva.   You have pus or a bad smell coming from your biopsy site.     You have a fever.   You have lower belly pain.  This information is not intended to replace advice given to you by your health care provider. Make sure you discuss any questions you have with your health care provider.  Document Released: 03/16/2012 Document Revised: 09/05/2015 Document Reviewed: 02/18/2015  Elsevier Interactive Patient Education  2018 [employer]

## 2017-06-04 NOTE — Progress Notes (Signed)
Subjective:     Patient ID: Christine Crawford, female   DOB: 10/04/1970, 47 y.o.   MRN: [id number]  HPI   Has chronic vulvitis.  No prior biopsy.  Pap History: 04/02/16 Pap and HR HPV negative  Recent yeast and BV infection treated with Diflucan and Flagyl.   No hx DM.   Review of Systems  LMP: 05/14/17 Contraception: Vasectomy     Objective:   Physical Exam  Genitourinary:        Consent for vulvar biopsy.  Generalized very mild erythema noted of the vulva.  Sterile prep with betadine. Local 1% lidocaine injected - lot [id number], exp 11/21 Biopsy of right inferior labia at junction of minora and majora using a 3 mm punch biopsy.  Tissue to path. AgNO3 used. Minimal EBL.  No complications.     Assessment:     Chronic vulvovaginitis.     Plan:     FU biopsy results. Instructions given.   After visit summary to patient.

## 2017-06-14 ENCOUNTER — Other Ambulatory Visit: Payer: Self-pay | Admitting: *Deleted

## 2017-06-14 MED ORDER — TRIAMCINOLONE ACETONIDE 0.1 % EX OINT: TOPICAL_OINTMENT | Freq: Two times a day (BID) | CUTANEOUS | Status: DC

## 2017-06-17 ENCOUNTER — Telehealth: Payer: Self-pay | Admitting: Obstetrics and Gynecology

## 2017-06-17 MED ORDER — TRIAMCINOLONE ACETONIDE 0.1 % EX OINT: TOPICAL_OINTMENT | CUTANEOUS | 0 refills | Status: DC

## 2017-06-17 NOTE — Telephone Encounter (Signed)
Patient said her prescription was not at her pharmacy after talking with Noreene LarssonJill. Patient is asking is Noreene LarssonJill would  check on this and call her back.

## 2017-06-17 NOTE — Telephone Encounter (Signed)
Triamcinolone 0.1 % ordered as a clinic administered med on 3/7. D/c medication, new RX order placed for triamcinolone ointment.   Call returned to patient, advised as seen above. Apologies for any inconvenience.  Patient thankful for return call, will f/u with pharmacy.   Routing to provider for final review. Patient is agreeable to disposition. Will close encounter.

## 2017-09-09 ENCOUNTER — Other Ambulatory Visit: Payer: Self-pay | Admitting: Family Medicine

## 2017-09-09 DIAGNOSIS — Z1231 Encounter for screening mammogram for malignant neoplasm of breast: Secondary | ICD-10-CM

## 2017-09-20 ENCOUNTER — Telehealth: Payer: Self-pay | Admitting: Obstetrics and Gynecology

## 2017-09-20 NOTE — Telephone Encounter (Signed)
Patient called requesting an appointment with Dr. Edward JollySilva this week, preferably in the afternoon. She said she thinks she may have a vaginal infection.

## 2017-09-20 NOTE — Telephone Encounter (Signed)
Call to patient. States has had intermittent retrun of vaginal symptoms similar to previous infection in 05-2017.  Affirm was positive for BV and yeast at that time.  Patient has self treated with a Diflucan she had at home without relief. Complains of white vaginal discharge with itching. Denies pain or fever.  Needs afternoon appointment. Scheduled Tues, 09-21-17 at 3:15 with Dr Hyacinth MeekerMiller ( Dr Edward JollySilva out of office, patient aware.)   Routing to provider for final review. Will close encounter.   CC: Dr Hyacinth MeekerMiller

## 2017-09-21 ENCOUNTER — Encounter: Payer: Self-pay | Admitting: Obstetrics & Gynecology

## 2017-09-21 ENCOUNTER — Ambulatory Visit (INDEPENDENT_AMBULATORY_CARE_PROVIDER_SITE_OTHER): Payer: BC Managed Care – PPO | Admitting: Obstetrics & Gynecology

## 2017-09-21 ENCOUNTER — Other Ambulatory Visit: Payer: Self-pay

## 2017-09-21 VITALS — BP 108/60 | HR 56 | Resp 16 | Ht 64.0 in | Wt 141.0 lb

## 2017-09-21 DIAGNOSIS — N760 Acute vaginitis: Secondary | ICD-10-CM

## 2017-09-21 MED ORDER — METRONIDAZOLE 500 MG PO TABS: 500.0000 mg | ORAL_TABLET | Freq: Two times a day (BID) | ORAL | 0 refills | Status: DC

## 2017-09-21 NOTE — Progress Notes (Signed)
GYNECOLOGY  VISIT  CC:   Vaginal itching  HPI: 47 y.o. [id number] Married [place] female here for vaginal itching with some mild vaginal discharge for over two weeks.  Has issues with recurrent vaginitis and has fluconazole on hand to use for this.  Reports she does not always feel this helps.  Most recently took one on May 25 and then again on May 28.    Also reports cycles have been changing some.  Prior cycle was 08/02/17 and then she skipped May and then started 09/15/17 and flow was normal.  She is not bleeding at this time.  Sometimes vaginitis symptoms seem related to menstrual cycle but not always.    Typically sees Dr. Edward JollySilva but did not want to wait until tomorrow for appt.    GYNECOLOGIC HISTORY: Patient's last menstrual period was 09/15/2017. Contraception: vasectomy  Menopausal hormone therapy: none  There are no active problems to display for this patient.   Past Medical History:  Diagnosis Date  . History of vaginal infection    Recurrents   . HSV-1 infection     Past Surgical History:  Procedure Laterality Date  . WISDOM TOOTH EXTRACTION      MEDS:   Current Outpatient Medications on File Prior to Visit  Medication Sig Dispense Refill  . ferrous sulfate 325 (65 FE) MG EC tablet Take 325 mg by mouth as needed.    . valACYclovir (VALTREX) 500 MG tablet Take 500 mg by mouth as needed.     No current facility-administered medications on file prior to visit.     ALLERGIES: Patient has no known allergies.  Family History  Problem Relation Age of Onset  . Cancer Mother        Colon  . Heart disease Father   . Breast cancer Maternal Aunt   . Breast cancer Paternal Aunt     SH:  Married, non smoker  Review of Systems  Genitourinary:       Menstrual cycle changes Vaginal itching   All other systems reviewed and are negative.   PHYSICAL EXAMINATION:    BP 108/60 (BP Location: Right Arm, Patient Position: Sitting, Cuff Size: Normal)   Pulse (!) 56   Resp  16   Ht 5\' 4"  (1.626 m)   Wt 141 lb (64 kg)   LMP 09/15/2017   BMI 24.20 kg/m     General appearance: alert, cooperative and appears stated age Abdomen: soft, non-tender; bowel sounds normal; no masses,  no organomegaly Lymph:  No inguinal LAD noted  Pelvic: External genitalia:  no lesions              Urethra:  normal appearing urethra with no masses, tenderness or lesions              Bartholins and Skenes: normal                 Vagina: normal appearing vagina with whitish discharge, no odor, no lesions noted              Cervix: no lesions              Bimanual Exam:  Uterus:  normal size, contour, position, consistency, mobility, non-tender              Adnexa: no mass, fullness, tenderness  Chaperone was present for exam.  Assessment: H/O recurrent vaginitis with current itching and vaginal discharge  Plan: Nuswab vaginitis testing obtained today.  Results will be called to pt.  Treatment with Flagyl 500mg  bid x 7 days started today.  Pt knows not have drink ETOH with this.

## 2017-09-28 ENCOUNTER — Telehealth: Payer: Self-pay | Admitting: Obstetrics & Gynecology

## 2017-09-28 NOTE — Telephone Encounter (Signed)
Checked with [facility], Nuswab results pending, should be available by 6/19.   Spoke with patient. Completed Flagyl, symptoms have improved, not resolved. Reports vaginal itching and light yellow d/c still present.   Advised patient results have not returned. Will update Dr. Hyacinth MeekerMiller and return call with recommendations once resulted.   Patient states she will be traveling out of the country next wk for 1 mo, requesting Dr. Hyacinth MeekerMiller keep that in mind and send RX to use during travel, if needed.   Routing to Dr. Hyacinth MeekerMiller. Labs pending.

## 2017-09-28 NOTE — Telephone Encounter (Signed)
Patient does not think her medication is working and would like to get the results from last week.

## 2017-09-29 NOTE — Telephone Encounter (Signed)
Please let her know the yeast portion of the test is still pending but the BV and trich portion were negative.  I think she has anti-fungal on hand.  What day, exactly, is she leaving?  Thanks.

## 2017-09-29 NOTE — Telephone Encounter (Signed)
Spoke with patient, advised as seen below per Dr. Hyacinth MeekerMiller. Patient states she no longer has any medication or prescriptions, will be leaving on 6/25.  Advised I will update Dr. Hyacinth MeekerMiller and return call once all labs have resulted. Patient agreeable.  Routing to Dr. Hyacinth MeekerMiller.

## 2017-09-30 LAB — NUSWAB VAGINITIS (VG)
Candida albicans, NAA: POSITIVE — AB
Candida glabrata, NAA: NEGATIVE
TRICH VAG BY NAA: NEGATIVE

## 2017-09-30 NOTE — Telephone Encounter (Signed)
Routed results to reina to call pt.  Ok to close encounter.

## 2017-10-01 MED ORDER — TERCONAZOLE 0.4 % VA CREA: 1.0000 | TOPICAL_CREAM | Freq: Every day | VAGINAL | 1 refills | Status: DC

## 2017-10-01 MED ORDER — FLUCONAZOLE 150 MG PO TABS: 150.0000 mg | ORAL_TABLET | Freq: Once | ORAL | 1 refills | Status: AC

## 2017-10-01 NOTE — Telephone Encounter (Signed)
Rx for diflucan sent to pharmacy on file.  OK to close encounter.

## 2017-10-01 NOTE — Telephone Encounter (Signed)
Jerene BearsMiller, Mary S, MD  Gara KronerMorales, Ison Wichmann C, CMA        Please let pt know her vaginitis testing showed yeast. I would recommend treating this with Terazol 7 as she had already used two diflucan tablets without success. Ok to sent to pharmacy with a RF. She will be traveling next week.    Patient notified of results.  Terazol 7 with 1 refill sent to pharmacy.   Patient requesting diflucan with refills to have for her trip.  Rx pended  Please advise.

## 2017-11-15 ENCOUNTER — Ambulatory Visit
Admission: RE | Admit: 2017-11-15 | Discharge: 2017-11-15 | Disposition: A | Discharge status: Home / Self Care | Payer: 59 | Source: Ambulatory Visit | Attending: Family Medicine | Admitting: Family Medicine

## 2017-11-15 DIAGNOSIS — Z1231 Encounter for screening mammogram for malignant neoplasm of breast: Secondary | ICD-10-CM

## 2018-02-17 ENCOUNTER — Other Ambulatory Visit: Payer: Self-pay | Admitting: Obstetrics and Gynecology

## 2018-02-17 NOTE — Telephone Encounter (Signed)
Patient needs refill on terconazole cream.

## 2018-02-17 NOTE — Telephone Encounter (Signed)
Medication refill request: Terazol 7 Cream  Last AEX:  05/19/17 Next AEX: nothing scheduled at this time  Last MMG (if hormonal medication request): 11/15/17  BI-rads 1 Neg  Refill authorized: 45G with 1RF

## 2018-02-18 ENCOUNTER — Telehealth: Payer: Self-pay | Admitting: Obstetrics and Gynecology

## 2018-02-18 NOTE — Telephone Encounter (Signed)
Detailed message left on mobile number per DPR with message as seen below from Dr. Edward Jolly. Advised patient to keep appointment as scheduled on Monday and return call if any additional questions.   Routing to provider and will close encounter.

## 2018-02-18 NOTE — Telephone Encounter (Signed)
Patient is asking to talk with a nurse regarding her prescription request.

## 2018-02-18 NOTE — Telephone Encounter (Signed)
Ok for her to take the Diflucan she has at home.  I will see her next week!

## 2018-02-18 NOTE — Telephone Encounter (Signed)
Call to patient. Advised patient that she needs to schedule an appointment prior to refill of terazol per Dr. Edward Jolly. OV offered for today at 3:30, but patient declined stating she is still at work and could not be seen then. Patient states itching and small amount of discharge started on Wednesday and she is really uncomfortable. Patient scheduled for Monday 02-21-18 at 1030, but patient states she does not want to go all weekend feeling this way. RN advised could try OTC medications, but patient states those do not work for her. Asking if she should take diflucan she has at home? RN advised would review symptoms with Dr. Edward Jolly and return call. Patient agreeable.   Routing to provider for review.

## 2018-02-21 ENCOUNTER — Ambulatory Visit: Payer: BC Managed Care – PPO | Admitting: Obstetrics and Gynecology

## 2018-02-21 ENCOUNTER — Telehealth: Payer: Self-pay | Admitting: Obstetrics and Gynecology

## 2018-02-21 NOTE — Telephone Encounter (Signed)
Patient has an appointment today for a vaginal infection. She stared her cycle and is wondering if she will need to reschedule.?

## 2018-02-21 NOTE — Telephone Encounter (Signed)
Spoke with patient. Took Diflucan on 11/8 and again on 11/10, symptoms have improved, not resolved. Menses started today and flow is heavy, patient is scheduled for OV this morning at 10:30am, asking of she should reschedule?  Patient placed on brief hold, reviewed with Dr. Edward Jolly, reschedule OV to after menses.   Spoke with patient. Patient states menses last approximately 5 days, requesting afternoon OV, OV rescheduled to 11/18 at 4:15pm. Appointment cancelled for today.   Routing to provider for final review. Patient is agreeable to disposition. Will close encounter.

## 2018-02-21 NOTE — Progress Notes (Deleted)
GYNECOLOGY  VISIT   HPI: 47 y.o.   Married  Caucasian/[place]ian  female   [phone] with No LMP recorded.   here for vaginitis.    GYNECOLOGIC HISTORY: No LMP recorded. Contraception: Vasectomy Menopausal hormone therapy:  none Last mammogram: 11-15-17 3D Neg/density B/BiRads1 Last pap smear: 04-02-16 Neg:Neg HR HPV        OB History    Gravida  2   Para  2   Term  2   Preterm  0   AB  0   Living  2     SAB  0   TAB  0   Ectopic  0   Multiple  0   Live Births  0              There are no active problems to display for this patient.   Past Medical History:  Diagnosis Date  . History of vaginal infection    Recurrents   . HSV-1 infection     Past Surgical History:  Procedure Laterality Date  . WISDOM TOOTH EXTRACTION      Current Outpatient Medications  Medication Sig Dispense Refill  . ferrous sulfate 325 (65 FE) MG EC tablet Take 325 mg by mouth as needed.    . metroNIDAZOLE (FLAGYL) 500 MG tablet Take 1 tablet (500 mg total) by mouth 2 (two) times daily. 14 tablet 0  . terconazole (TERAZOL 7) 0.4 % vaginal cream Place 1 applicator vaginally at bedtime. 45 g 1  . valACYclovir (VALTREX) 500 MG tablet Take 500 mg by mouth as needed.     No current facility-administered medications for this visit.      ALLERGIES: Patient has no known allergies.  Family History  Problem Relation Age of Onset  . Cancer Mother        Colon  . Heart disease Father   . Breast cancer Maternal Aunt   . Breast cancer Paternal Aunt     Social History   Socioeconomic History  . Marital status: Married    Spouse name: Not on file  . Number of children: Not on file  . Years of education: Not on file  . Highest education level: Not on file  Occupational History  . Not on file  Social Needs  . Financial resource strain: Not on file  . Food insecurity:    Worry: Not on file    Inability: Not on file  . Transportation needs:    Medical: Not on file   Non-medical: Not on file  Tobacco Use  . Smoking status: Never Smoker  . Smokeless tobacco: Never Used  Substance and Sexual Activity  . Alcohol use: Yes    Alcohol/week: 1.0 standard drinks    Types: 1 Glasses of wine per week  . Drug use: No  . Sexual activity: Yes    Partners: Male    Comment: husband's vasectomy  Lifestyle  . Physical activity:    Days per week: Not on file    Minutes per session: Not on file  . Stress: Not on file  Relationships  . Social connections:    Talks on phone: Not on file    Gets together: Not on file    Attends religious service: Not on file    Active member of club or organization: Not on file    Attends meetings of clubs or organizations: Not on file    Relationship status: Not on file  . Intimate partner violence:    Fear  of current or ex partner: Not on file    Emotionally abused: Not on file    Physically abused: Not on file    Forced sexual activity: Not on file  Other Topics Concern  . Not on file  Social History Narrative  . Not on file    Review of Systems  PHYSICAL EXAMINATION:    There were no vitals taken for this visit.    General appearance: alert, cooperative and appears stated age Head: Normocephalic, without obvious abnormality, atraumatic Neck: no adenopathy, supple, symmetrical, trachea midline and thyroid normal to inspection and palpation Lungs: clear to auscultation bilaterally Breasts: normal appearance, no masses or tenderness, No nipple retraction or dimpling, No nipple discharge or bleeding, No axillary or supraclavicular adenopathy Heart: regular rate and rhythm Abdomen: soft, non-tender, no masses,  no organomegaly Extremities: extremities normal, atraumatic, no cyanosis or edema Skin: Skin color, texture, turgor normal. No rashes or lesions Lymph nodes: Cervical, supraclavicular, and axillary nodes normal. No abnormal inguinal nodes palpated Neurologic: Grossly normal  Pelvic: External genitalia:  no  lesions              Urethra:  normal appearing urethra with no masses, tenderness or lesions              Bartholins and Skenes: normal                 Vagina: normal appearing vagina with normal color and discharge, no lesions              Cervix: no lesions                Bimanual Exam:  Uterus:  normal size, contour, position, consistency, mobility, non-tender              Adnexa: no mass, fullness, tenderness              Rectal exam: {yes no:314532}.  Confirms.              Anus:  normal sphincter tone, no lesions  Chaperone was present for exam.  ASSESSMENT     PLAN     An After Visit Summary was printed and given to the patient.  ______ minutes face to face time of which over 50% was spent in counseling.

## 2018-02-28 ENCOUNTER — Other Ambulatory Visit: Payer: Self-pay

## 2018-02-28 ENCOUNTER — Ambulatory Visit (INDEPENDENT_AMBULATORY_CARE_PROVIDER_SITE_OTHER): Payer: BC Managed Care – PPO | Admitting: Obstetrics and Gynecology

## 2018-02-28 ENCOUNTER — Encounter: Payer: Self-pay | Admitting: Obstetrics and Gynecology

## 2018-02-28 VITALS — BP 102/64 | HR 60 | Ht 64.0 in | Wt 143.8 lb

## 2018-02-28 DIAGNOSIS — N761 Subacute and chronic vaginitis: Secondary | ICD-10-CM

## 2018-02-28 NOTE — Progress Notes (Signed)
GYNECOLOGY  VISIT   HPI: 47 y.o.   Married  [place]  female   [phone] with Patient's last menstrual period was 02/21/2018 (exact date).   here for frequent yeast and bacterial vaginitis. Patient wants to discuss this problem. She states she is asymptomatic at this time   Took Diflucan at home and did present for evaluation but had menstruation and so did not have evaluation.  Last episode of vaginitis was in June and saw Dr. Hyacinth Meeker.  Treated with Terazol and received an Rx of Diflucan.  Patient has had both BV and yeast.  Does not want to do a long term treatment with Diflucan.  Menses are not always regular.  Went 50 days without menstruation during this last year.  Does not eat much carbohydrate.   Uses Dove soap.  GYNECOLOGIC HISTORY: Patient's last menstrual period was 02/21/2018 (exact date). Contraception: Vasectomy Menopausal hormone therapy:  none Last mammogram: 11-15-17 3D Neg/density B/BiRads1 Last pap smear: 04-02-16 Neg:Neg HR HPV        OB History    Gravida  2   Para  2   Term  2   Preterm  0   AB  0   Living  2     SAB  0   TAB  0   Ectopic  0   Multiple  0   Live Births  0              There are no active problems to display for this patient.   Past Medical History:  Diagnosis Date  . History of vaginal infection    Recurrents   . HSV-1 infection     Past Surgical History:  Procedure Laterality Date  . WISDOM TOOTH EXTRACTION      Current Outpatient Medications  Medication Sig Dispense Refill  . ferrous sulfate 325 (65 FE) MG EC tablet Take 325 mg by mouth as needed.    . valACYclovir (VALTREX) 500 MG tablet Take 500 mg by mouth as needed.     No current facility-administered medications for this visit.      ALLERGIES: Patient has no known allergies.  Family History  Problem Relation Age of Onset  . Cancer Mother        Colon  . Heart disease Father   . Breast cancer Maternal Aunt   . Breast cancer Paternal Aunt      Social History   Socioeconomic History  . Marital status: Married    Spouse name: Not on file  . Number of children: Not on file  . Years of education: Not on file  . Highest education level: Not on file  Occupational History  . Not on file  Social Needs  . Financial resource strain: Not on file  . Food insecurity:    Worry: Not on file    Inability: Not on file  . Transportation needs:    Medical: Not on file    Non-medical: Not on file  Tobacco Use  . Smoking status: Never Smoker  . Smokeless tobacco: Never Used  Substance and Sexual Activity  . Alcohol use: Yes    Alcohol/week: 1.0 standard drinks    Types: 1 Glasses of wine per week  . Drug use: No  . Sexual activity: Yes    Partners: Male    Comment: husband's vasectomy  Lifestyle  . Physical activity:    Days per week: Not on file    Minutes per session: Not on file  .  Stress: Not on file  Relationships  . Social connections:    Talks on phone: Not on file    Gets together: Not on file    Attends religious service: Not on file    Active member of club or organization: Not on file    Attends meetings of clubs or organizations: Not on file    Relationship status: Not on file  . Intimate partner violence:    Fear of current or ex partner: Not on file    Emotionally abused: Not on file    Physically abused: Not on file    Forced sexual activity: Not on file  Other Topics Concern  . Not on file  Social History Narrative  . Not on file    Review of Systems  All other systems reviewed and are negative.   PHYSICAL EXAMINATION:    BP 102/64 (BP Location: Right Arm, Patient Position: Sitting, Cuff Size: Normal)   Pulse 60   Ht 5\' 4"  (1.626 m)   Wt 143 lb 12.8 oz (65.2 kg)   LMP 02/21/2018 (Exact Date)   BMI 24.Christine kg/m     General appearance: alert, cooperative and appears stated age  ASSESSMENT  Chronic vaginitis.   PLAN  We discussed various forms of vaginitis and the different treatments -  antifungal, antibiotics, and vaginal estrogen for atrophy.  She may treat with OTC Monistat and then present to the office if her symptom do not resolve. We discussed risk factors for vaginitis. She will avoid irritants. FU prn.   An After Visit Summary was printed and given to the patient.  __25____ minutes face to face time of which over 50% was spent in counseling.

## 2018-03-03 DIAGNOSIS — N760 Acute vaginitis: Secondary | ICD-10-CM

## 2018-03-03 HISTORY — DX: Acute vaginitis: N76.0

## 2018-05-02 ENCOUNTER — Encounter: Payer: Self-pay | Admitting: Obstetrics and Gynecology

## 2018-05-02 ENCOUNTER — Other Ambulatory Visit: Payer: Self-pay

## 2018-05-02 ENCOUNTER — Ambulatory Visit (INDEPENDENT_AMBULATORY_CARE_PROVIDER_SITE_OTHER): Payer: BC Managed Care – PPO | Admitting: Obstetrics and Gynecology

## 2018-05-02 ENCOUNTER — Telehealth: Payer: Self-pay | Admitting: Obstetrics and Gynecology

## 2018-05-02 VITALS — BP 118/72 | HR 60 | Ht 64.0 in | Wt 145.2 lb

## 2018-05-02 DIAGNOSIS — B379 Candidiasis, unspecified: Secondary | ICD-10-CM | POA: Diagnosis not present

## 2018-05-02 DIAGNOSIS — N760 Acute vaginitis: Secondary | ICD-10-CM | POA: Diagnosis not present

## 2018-05-02 NOTE — Progress Notes (Signed)
GYNECOLOGY  VISIT   HPI: 48 y.o.   Married  [place]  female   [phone] with Patient's last menstrual period was 04/14/2018 (exact date).   here for vaginal itching--worse after wiping. This began 3 days ago.  Has recurrent yeast and BV infections.   Feels discomfort with wiping after voiding.  Having discharge.  No strong odor.   No over the counter treatments.   Terazol works best for her.   No other changes in her routine.   States she had an infection in December and treated with Diflucan that she had on hand while traveling to [place].  Does not like to take Diflucan with regularity.   Menses skipped in December. She went 50 + days without menstruation.  Some hot flashes at night.  Decreased libido.  No pain or dryness with intercourse.   Donates blood regularly, so she has regular HIV testing with this.   Really wants to get to the cause of her frequent yeast infections.   GYNECOLOGIC HISTORY: Patient's last menstrual period was 04/14/2018 (exact date). Contraception: Vasectomy Menopausal hormone therapy:  none Last mammogram: 11-15-17 3D Neg/density B/BiRads1  Last pap smear: 04-02-16 Neg:Neg HR HPV        OB History    Gravida  2   Para  2   Term  2   Preterm  0   AB  0   Living  2     SAB  0   TAB  0   Ectopic  0   Multiple  0   Live Births  0              Patient Active Problem List   Diagnosis Date Noted  . Chronic vaginitis 03/03/2018    Past Medical History:  Diagnosis Date  . History of vaginal infection    Recurrents   . HSV-1 infection     Past Surgical History:  Procedure Laterality Date  . WISDOM TOOTH EXTRACTION      Current Outpatient Medications  Medication Sig Dispense Refill  . Multiple Vitamins-Minerals (MULTIVITAMIN WOMEN PO) Take 1 tablet by mouth daily.    . valACYclovir (VALTREX) 500 MG tablet Take 500 mg by mouth as needed.     No current facility-administered medications for this visit.       ALLERGIES: Patient has no known allergies.  Family History  Problem Relation Age of Onset  . Cancer Mother        Colon  . Heart disease Father   . Breast cancer Maternal Aunt   . Breast cancer Paternal Aunt     Social History   Socioeconomic History  . Marital status: Married    Spouse name: Not on file  . Number of children: Not on file  . Years of education: Not on file  . Highest education level: Not on file  Occupational History  . Not on file  Social Needs  . Financial resource strain: Not on file  . Food insecurity:    Worry: Not on file    Inability: Not on file  . Transportation needs:    Medical: Not on file    Non-medical: Not on file  Tobacco Use  . Smoking status: Never Smoker  . Smokeless tobacco: Never Used  Substance and Sexual Activity  . Alcohol use: Yes    Alcohol/week: 1.0 standard drinks    Types: 1 Glasses of wine per week  . Drug use: No  . Sexual activity: Yes  Partners: Male    Comment: husband's vasectomy  Lifestyle  . Physical activity:    Days per week: Not on file    Minutes per session: Not on file  . Stress: Not on file  Relationships  . Social connections:    Talks on phone: Not on file    Gets together: Not on file    Attends religious service: Not on file    Active member of club or organization: Not on file    Attends meetings of clubs or organizations: Not on file    Relationship status: Not on file  . Intimate partner violence:    Fear of current or ex partner: Not on file    Emotionally abused: Not on file    Physically abused: Not on file    Forced sexual activity: Not on file  Other Topics Concern  . Not on file  Social History Narrative  . Not on file    Review of Systems  All other systems reviewed and are negative.   PHYSICAL EXAMINATION:    BP 118/72 (BP Location: Right Arm, Patient Position: Sitting, Cuff Size: Normal)   Pulse 60   Ht 5\' 4"  (1.626 m)   Wt 145 lb 3.2 oz (65.9 kg)   LMP  04/14/2018 (Exact Date)   BMI 24.92 kg/m     General appearance: alert, cooperative and appears stated age   Pelvic: External genitalia:   Erythema of vulva.              Urethra:  normal appearing urethra with no masses, tenderness or lesions              Bartholins and Skenes: normal                 Vagina: normal appearing vagina with normal color and small amount white discharge, no lesions              Cervix: no lesions                Bimanual Exam:  Uterus:  normal size, contour, position, consistency, mobility, non-tender              Adnexa: no mass, fullness, tenderness           Chaperone was present for exam.  ASSESSMENT  Recurrent vulvovaginitis.  Yeast infection and bacterial vaginosis, but mostly Candida.   PLAN  Affirm.  Tx plan to follow.  We talked about not using restrictive clothing.  Referral to ID.    An After Visit Summary was printed and given to the patient.  _15_____ minutes face to face time of which over 50% was spent in counseling.

## 2018-05-02 NOTE — Telephone Encounter (Signed)
Patient is calling regarding a reoccurring vaginal infection. Patient stated that she is having vaginal discharge, odor, and itching.

## 2018-05-02 NOTE — Telephone Encounter (Signed)
Spoke with patient. Patient reports mild vaginal odor, white to yellow vaginal d/c and itching. Symptoms started 1/17. Has not tried anything OTC, patient has hx of recurrent vaginitis, is requesting OV today.   Denies vaginal bleeding or pelvic pain.   OV scheduled  for today ar 3:15pm with Dr. Edward Jolly. Patient verbalizes understanding and is agreeable.   Encounter closed.

## 2018-05-03 ENCOUNTER — Other Ambulatory Visit: Payer: Self-pay | Admitting: *Deleted

## 2018-05-03 LAB — VAGINITIS/VAGINOSIS, DNA PROBE
CANDIDA SPECIES: POSITIVE — AB
GARDNERELLA VAGINALIS: POSITIVE — AB
TRICHOMONAS VAG: NEGATIVE

## 2018-05-03 MED ORDER — METRONIDAZOLE 500 MG PO TABS: 500.0000 mg | ORAL_TABLET | Freq: Two times a day (BID) | ORAL | 0 refills | Status: DC

## 2018-05-03 MED ORDER — TERCONAZOLE 0.4 % VA CREA: 1.0000 | TOPICAL_CREAM | Freq: Every day | VAGINAL | 0 refills | Status: AC

## 2018-06-02 ENCOUNTER — Encounter: Payer: Self-pay | Admitting: Obstetrics and Gynecology

## 2018-06-02 ENCOUNTER — Ambulatory Visit (INDEPENDENT_AMBULATORY_CARE_PROVIDER_SITE_OTHER): Payer: BC Managed Care – PPO | Admitting: Obstetrics and Gynecology

## 2018-06-02 ENCOUNTER — Other Ambulatory Visit: Payer: Self-pay

## 2018-06-02 ENCOUNTER — Ambulatory Visit: Payer: BC Managed Care – PPO | Admitting: Obstetrics and Gynecology

## 2018-06-02 VITALS — BP 110/52 | HR 76 | Resp 14 | Ht 63.0 in | Wt 146.2 lb

## 2018-06-02 DIAGNOSIS — N760 Acute vaginitis: Secondary | ICD-10-CM

## 2018-06-02 DIAGNOSIS — N898 Other specified noninflammatory disorders of vagina: Secondary | ICD-10-CM

## 2018-06-02 NOTE — Patient Instructions (Signed)
Vaginite Vaginitis A vaginite  uma inflamao da vagina. Ela  causada mais frequentemente por uma alterao do equilbrio normal de bactrias e leveduras que vivem na vagina. Essa alterao do equilbrio causa um crescimento excessivo de determinadas bactrias ou leveduras, o que causa a inflamao. H diferentes tipos de vaginite, mas os tipos mais comuns so:  Vaginose bacteriana.  Infeco por levedura (candidase).  Vaginite por tricomonase. Esta  uma infeco sexualmente transmissvel (IST).  Vaginite viral.  Vaginite atrfica.  Vaginite alrgica. Quais so as causas? A causa depende do tipo de vaginite. A vaginite pode ser causada por:  Bactrias (vaginose bacteriana).  Leveduras (infeco por levedura).  Um parasita (vaginite por tricomonase)  Um vrus (vaginite viral).  Baixos nveis hormonais (vaginite atrfica). Baixos nveis hormonais podem ocorrer durante a gravidez, a amamentao ou aps a menopausa.  Substncias irritantes, como banhos de espuma, absorventes com fragrncia e sprays femininos (vaginite alrgica). Outros fatores podem alterar o equilbrio normal das leveduras e bactrias que vivem na vagina. Eles incluem:  Medicamentos antibiticos.  Higiene insuficiente.  Diafragmas, esponjas vaginais, espermicidas, plulas anticoncepcionais e dispositivos intrauterinos (DIUs).  Relaes sexuais.  Infeco.  Diabetes descontrolada.  Um sistema imune enfraquecido. Quais so os sinais ou sintomas? Os sintomas podem variar dependendo da causa da vaginite. Os sintomas comuns incluem:  Corrimento vaginal anormal. ? O corrimento  branco, cinza ou amarelado na vaginose bacteriana. ? O corrimento  espesso, branco e semelhante a queijo na infeco por levedura. ? O corrimento  espumoso e amarelado ou esverdeado na tricomonase.  Odor forte na vagina. ? O odor lembra o odor de peixe na vaginose bacteriana.  Coceira, dor ou inchao na vagina.  Dor  durante as relaes sexuais.  Dor ou ardor ao urinar. Algumas vezes, no h sintomas. Como esse quadro clnico  tratado? O tratamento varia dependendo do tipo de infeco.  A vaginose bacteriana e a tricomonase so frequentemente tratadas com cremes ou plulas antibiticas.  Infeces por levedura so frequentemente tratadas com medicamentos antifngicos, como cremes ou supositrios vaginais.  A vaginite viral no tem cura, mas os sintomas podem ser tratados com medicamentos que aliviam o desconforto. Seu parceiro ou sua parceira sexual tambm deve receber tratamento.  A vaginite atrfica pode ser tratada com creme, plula, supositrio ou anel vaginal com estrognio. Caso ocorra secura vaginal, lubrificantes e cremes umidificantes podero ajudar. Voc pode ser instruda a evitar sabonetes, sprays ou duchas vaginais com fragrncia.  O tratamento da vaginite alrgica inclui interromper o uso do produto que est causando o problema. Cremes vaginais podem ser usados no tratamento dos sintomas. Siga essas instrues em casa:  Tome todos os medicamentos de acordo com as orientaes do seu mdico.  Mantenha a rea genital limpa e seca. Evite sabonete e somente enxgue a rea com gua.  Evite duchas vaginais. Duchas vaginais podem remover bactrias saudveis da vagina.  No use absorventes internos nem tenha relaes sexuais at a vaginite ter sido tratada. Use absorventes externos se apresentar vaginite.  Limpe-se da frente para trs. Isso evita a passagem de bactrias do reto para a vagina.  Areje a rea genital. ? Use roupa de baixo de algodo para reduzir o acmulo de umidade. ? Evite usar roupa de baixo ao dormir at a vaginite desaparecer. ? Evite calas apertadas, roupas de baixo ou meias-calas de nylon sem um protetor de algodo. ? Tire roupas molhadas (especialmente trajos de banho) assim que possvel.  Use produtos suaves e sem fragrncia. Evite usar produtos irritantes, como:  ? Sprays femininos   com fragrncia. ? Amaciantes de roupa. ? Detergentes com fragrncia. ? Absorventes internos com fragrncia. ? Sabonetes ou banhos de espuma com fragrncia.  Pratique sexo seguro e use preservativos. Preservativos podem evitar a transmisso da tricomonase e da vaginite viral. Entre em contato com um mdico se:  Sentir dores abdominais.  Apresentar sintomas que durem mais de 2 a 3 dias.  Tiver febre e seus sintomas piorarem subitamente. Estas informaes no se destinam a substituir as recomendaes de seu mdico. No deixe de discutir quaisquer dvidas com seu mdico. Document Released: 12/23/2011 Document Revised: 09/08/2016 Document Reviewed: 09/08/2016 Elsevier Interactive Patient Education  2019 Elsevier Inc.  

## 2018-06-02 NOTE — Progress Notes (Signed)
GYNECOLOGY  VISIT   HPI: 48 y.o.   Married  Caucasian/[place]  female   [phone] with Patient's last menstrual period was 05/10/2018.   here for   Vaginal itching.  No significant discharge.   Pain with urination.   Treated with Flagyl and Terazol each for 7 days.  Will have consultation with ID the beginning of March for her recurrent vaginitis.   She had a vulvar biopsy 06/04/17 of the vulva which showed mild chronic inflammation.  Her blood sugar has been tested in the past and is normal.   GYNECOLOGIC HISTORY: Patient's last menstrual period was 05/10/2018. Contraception: Vasectomy Menopausal hormone therapy:  none Last mammogram:  11-15-17 3D Neg/density B/BiRads1  Last pap smear: 04-02-16 Neg:Neg HR HPV        OB History    Gravida  2   Para  2   Term  2   Preterm  0   AB  0   Living  2     SAB  0   TAB  0   Ectopic  0   Multiple  0   Live Births  0              Patient Active Problem List   Diagnosis Date Noted  . Chronic vaginitis 03/03/2018    Past Medical History:  Diagnosis Date  . History of vaginal infection    Recurrents   . HSV-1 infection     Past Surgical History:  Procedure Laterality Date  . WISDOM TOOTH EXTRACTION      Current Outpatient Medications  Medication Sig Dispense Refill  . Multiple Vitamins-Minerals (MULTIVITAMIN WOMEN PO) Take 1 tablet by mouth daily.    . valACYclovir (VALTREX) 500 MG tablet Take 500 mg by mouth as needed.     No current facility-administered medications for this visit.      ALLERGIES: Patient has no known allergies.  Family History  Problem Relation Age of Onset  . Cancer Mother        Colon  . Heart disease Father   . Breast cancer Maternal Aunt   . Breast cancer Paternal Aunt     Social History   Socioeconomic History  . Marital status: Married    Spouse name: Not on file  . Number of children: Not on file  . Years of education: Not on file  . Highest education level:  Not on file  Occupational History  . Not on file  Social Needs  . Financial resource strain: Not on file  . Food insecurity:    Worry: Not on file    Inability: Not on file  . Transportation needs:    Medical: Not on file    Non-medical: Not on file  Tobacco Use  . Smoking status: Never Smoker  . Smokeless tobacco: Never Used  Substance and Sexual Activity  . Alcohol use: Yes    Alcohol/week: 1.0 standard drinks    Types: 1 Glasses of wine per week  . Drug use: No  . Sexual activity: Yes    Partners: Male    Comment: husband's vasectomy  Lifestyle  . Physical activity:    Days per week: Not on file    Minutes per session: Not on file  . Stress: Not on file  Relationships  . Social connections:    Talks on phone: Not on file    Gets together: Not on file    Attends religious service: Not on file    Active member  of club or organization: Not on file    Attends meetings of clubs or organizations: Not on file    Relationship status: Not on file  . Intimate partner violence:    Fear of current or ex partner: Not on file    Emotionally abused: Not on file    Physically abused: Not on file    Forced sexual activity: Not on file  Other Topics Concern  . Not on file  Social History Narrative  . Not on file    Review of Systems  Constitutional: Negative.   HENT: Negative.   Eyes: Negative.   Respiratory: Negative.   Cardiovascular: Negative.   Gastrointestinal: Negative.   Endocrine: Negative.   Genitourinary:       Vulvar/vaginal itching   Musculoskeletal: Negative.   Skin: Negative.   Allergic/Immunologic: Negative.   Neurological: Negative.   Hematological: Negative.   Psychiatric/Behavioral: Negative.     PHYSICAL EXAMINATION:    BP (!) 110/52 (BP Location: Right Arm, Patient Position: Sitting, Cuff Size: Normal)   Pulse 76   Resp 14   Ht 5\' 3"  (1.6 m)   Wt 146 lb 4 oz (66.3 kg)   LMP 05/10/2018   BMI 25.91 kg/m     General appearance: alert,  cooperative and appears stated age    Pelvic: External genitalia:   Erythema of the labia.               Urethra:  normal appearing urethra with no masses, tenderness or lesions              Bartholins and Skenes: normal                 Vagina: normal appearing vagina with normal color and discharge, no lesions              Cervix: no lesions                Bimanual Exam:  Uterus:  normal size, contour, position, consistency, mobility, non-tender              Adnexa: no mass, fullness, tenderness              Chaperone was present for exam.  ASSESSMENT   Recurrent vaginitis.  BV and yeast.   PLAN  Affirm testing.  Will await results to treat.  She will have a consultation with ID in March.  I did discuss suppression regimens and she would like to hold off on this until she has seen ID.   An After Visit Summary was printed and given to the patient.  __15____ minutes face to face time of which over 50% was spent in counseling.

## 2018-06-03 ENCOUNTER — Other Ambulatory Visit: Payer: Self-pay | Admitting: *Deleted

## 2018-06-03 LAB — VAGINITIS/VAGINOSIS, DNA PROBE
Candida Species: POSITIVE — AB
Gardnerella vaginalis: POSITIVE — AB
Trichomonas vaginosis: NEGATIVE

## 2018-06-03 MED ORDER — FLUCONAZOLE 150 MG PO TABS: ORAL_TABLET | ORAL | 0 refills | Status: DC

## 2018-06-03 MED ORDER — METRONIDAZOLE 500 MG PO TABS: 500.0000 mg | ORAL_TABLET | Freq: Two times a day (BID) | ORAL | 0 refills | Status: DC

## 2018-06-13 ENCOUNTER — Encounter: Payer: Self-pay | Admitting: Internal Medicine

## 2018-06-13 ENCOUNTER — Other Ambulatory Visit: Payer: Self-pay

## 2018-06-13 ENCOUNTER — Ambulatory Visit (INDEPENDENT_AMBULATORY_CARE_PROVIDER_SITE_OTHER): Payer: BC Managed Care – PPO | Admitting: Internal Medicine

## 2018-06-13 DIAGNOSIS — N761 Subacute and chronic vaginitis: Secondary | ICD-10-CM | POA: Diagnosis not present

## 2018-06-13 DIAGNOSIS — B001 Herpesviral vesicular dermatitis: Secondary | ICD-10-CM | POA: Insufficient documentation

## 2018-06-13 DIAGNOSIS — Z8619 Personal history of other infectious and parasitic diseases: Secondary | ICD-10-CM

## 2018-06-13 DIAGNOSIS — N760 Acute vaginitis: Secondary | ICD-10-CM

## 2018-06-13 MED ORDER — FLUCONAZOLE 150 MG PO TABS: ORAL_TABLET | ORAL | 3 refills | Status: DC

## 2018-06-13 MED ORDER — FLUCONAZOLE 100 MG PO TABS: 100.0000 mg | ORAL_TABLET | ORAL | 11 refills | Status: DC

## 2018-06-13 NOTE — Progress Notes (Signed)
[facility]  Reason for Consult: Recurrent vaginitis Referring Provider: Janean Sark  Assessment: I find no obvious explanation for her recurrent vaginitis other than bad luck.  She is frustrated with the frequency of her episodes and says that she is willing to try another approach if I think that that might work.  Up till now she has not wanted to try chronic maintenance, suppressive therapy but states that she is willing to do that now.  I suggested that we start by trying a 73-month course of suppressive fluconazole to see if that helps to decrease frequency of symptoms.  I do not want to start suppressive fluconazole and suppressive, topical metronidazole gel at the same time.  If she does note improvement it would be impossible to know which medication helped.  Plan: 1. Check CBC and CMP today 2. Start fluconazole 150 mg to be taken every 3 days for 3 doses then once weekly 3. Follow-up here in 2 months  Patient Active Problem List   Diagnosis Date Noted  . 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   FLUCONAZOLE (DIFLUCAN) 150 MG TABLET    Take one tablet every 72 hours for 3 doses then once weekly  Previous Medications   FLUCONAZOLE (DIFLUCAN) 150 MG TABLET    Take one tablet (150mg ) PO now, may repeat in 72 hours if symptoms still present.   METRONIDAZOLE (FLAGYL) 500 MG TABLET    Take 1 tablet (500 mg total) by mouth 2 (two) times daily.   MULTIPLE VITAMINS-MINERALS (MULTIVITAMIN WOMEN PO)    Take 1 tablet by mouth daily.   VALACYCLOVIR (VALTREX) 500 MG TABLET    Take 500 mg by mouth as needed.  Modified Medications   No medications on file  Discontinued Medications   No medications on file    HPI: Christine Crawford is a 48 y.o. female with a history  of recurrent vaginitis.  She says that this has been occurring ever since she had her daughter 16 years ago.  It has been more frequent in the past few years. She estimates that she has an episode every 2 to 3 months.  She usually notes itching and burning.  She will occasionally have some vaginal discharge and notes some redness of her labia.  She has been diagnosed with both candidal vaginitis and bacterial vaginosis.  On some occasions she has both simultaneously.  She is married and believes she is in a mutually monogamous relationship.  She has been treated on multiple occasions with oral fluconazole, topical terconazol and oral metronidazole.  She feels that each of these have offered fairly consistent relief of her symptoms.  She does not feel that over-the-counter antifungal creams help very much.  She tried boric acid on a few occasions but did not feel that that helped.  She took a probiotic for 4 to 5 months and did not notice any improvement.  She does not notice any direct correlation with her menses.  Her cycle is coming on a more irregular basis now and she wonders if she is entering menopause.  She uses pads only during her menses.  She always wears cotton underwear.  She does not suffer any unusual vaginal dryness or pain  with intercourse.  She and her husband have never abstained from sex for a long enough period of time to see if that made any difference.  He has on occasion used condoms but they have not noted any decrease in frequency or severity of her symptoms.  She underwent a labial biopsy 1 year ago.  It showed "mild chronic inflammation".  When she was last seen by Dr. Edward Jolly on 06/02/2018 she did have some labial erythema but no other abnormalities on exam.  DNA probes were again positive for both Candida and Gardnerella.  She was treated with 7 days of metronidazole and terconazole with improvement in her symptoms.  She is not on any antibiotics currently.  She has been fairly  healthy otherwise.  The only other medications she takes are a multivitamin and valacyclovir as needed for herpes fever blisters.  She does not have diabetes or HIV infection.  She is not on steroids.  Review of Systems: Review of Systems  Constitutional: Negative for chills, diaphoresis, fever and weight loss.  Gastrointestinal: Negative for abdominal pain, diarrhea, nausea and vomiting.  Genitourinary: Negative for dysuria.  Skin: Negative for rash.      Past Medical History:  Diagnosis Date  . History of vaginal infection    Recurrents   . HSV-1 infection     Social History   Tobacco Use  . Smoking status: Never Smoker  . Smokeless tobacco: Never Used  Substance Use Topics  . Alcohol use: Yes    Alcohol/week: 1.0 standard drinks    Types: 1 Glasses of wine per week  . Drug use: No    Family History  Problem Relation Age of Onset  . Cancer Mother        Colon  . Heart disease Father   . Breast cancer Maternal Aunt   . Breast cancer Paternal Aunt    No Known Allergies  OBJECTIVE: Vitals:   06/13/18 1504  BP: 109/75  Pulse: 65  Weight: 145 lb (65.8 kg)  Height: 5\' 3"  (1.6 m)   Body mass index is 25.69 kg/m.   Physical Exam Constitutional:      Comments: She is in good spirits.  She appears healthy.  HENT:     Mouth/Throat:     Pharynx: No oropharyngeal exudate.  Eyes:     Conjunctiva/sclera: Conjunctivae normal.  Cardiovascular:     Rate and Rhythm: Normal rate and regular rhythm.     Heart sounds: No murmur.  Pulmonary:     Effort: Pulmonary effort is normal.     Breath sounds: Normal breath sounds.  Abdominal:     Palpations: Abdomen is soft.     Tenderness: There is no abdominal tenderness.  Skin:    Findings: No rash.  Psychiatric:        Mood and Affect: Mood normal.     Microbiology: No results found for this or any previous visit (from the past 240 hour(s)).  Cliffton Asters, MD [facility] [phone] pager   [phone] cell 06/13/2018, 4:42 PM

## 2018-06-14 LAB — CBC
HCT: 41.2 % (ref 35.0–45.0)
HEMOGLOBIN: 13.7 g/dL (ref 11.7–15.5)
MCH: 29.8 pg (ref 27.0–33.0)
MCHC: 33.3 g/dL (ref 32.0–36.0)
MCV: 89.8 fL (ref 80.0–100.0)
MPV: 11 fL (ref 7.5–12.5)
Platelets: 292 10*3/uL (ref 140–400)
RBC: 4.59 10*6/uL (ref 3.80–5.10)
RDW: 12.8 % (ref 11.0–15.0)
WBC: 10.3 10*3/uL (ref 3.8–10.8)

## 2018-06-14 LAB — COMPREHENSIVE METABOLIC PANEL
AG RATIO: 1.6 (calc) (ref 1.0–2.5)
ALT: 19 U/L (ref 6–29)
AST: 23 U/L (ref 10–35)
Albumin: 4.1 g/dL (ref 3.6–5.1)
Alkaline phosphatase (APISO): 55 U/L (ref 31–125)
BILIRUBIN TOTAL: 0.3 mg/dL (ref 0.2–1.2)
BUN: 17 mg/dL (ref 7–25)
CALCIUM: 9.6 mg/dL (ref 8.6–10.2)
CHLORIDE: 105 mmol/L (ref 98–110)
CO2: 27 mmol/L (ref 20–32)
Creat: 0.84 mg/dL (ref 0.50–1.10)
GLOBULIN: 2.6 g/dL (ref 1.9–3.7)
Glucose, Bld: 82 mg/dL (ref 65–99)
Potassium: 4.1 mmol/L (ref 3.5–5.3)
Sodium: 140 mmol/L (ref 135–146)
Total Protein: 6.7 g/dL (ref 6.1–8.1)

## 2018-08-16 ENCOUNTER — Ambulatory Visit: Payer: BC Managed Care – PPO | Admitting: Internal Medicine

## 2018-08-18 ENCOUNTER — Encounter: Payer: Self-pay | Admitting: Internal Medicine

## 2018-08-18 ENCOUNTER — Ambulatory Visit (INDEPENDENT_AMBULATORY_CARE_PROVIDER_SITE_OTHER): Payer: BC Managed Care – PPO | Admitting: Internal Medicine

## 2018-08-18 ENCOUNTER — Other Ambulatory Visit: Payer: Self-pay

## 2018-08-18 DIAGNOSIS — N761 Subacute and chronic vaginitis: Secondary | ICD-10-CM | POA: Diagnosis not present

## 2018-08-18 MED ORDER — FLUCONAZOLE 100 MG PO TABS: 100.0000 mg | ORAL_TABLET | ORAL | 11 refills | Status: DC

## 2018-08-18 NOTE — Progress Notes (Signed)
Virtual Visit via Telephone Note  I connected with Christine Crawford on 08/18/18 at  8:45 AM EDT by telephone and verified that I am speaking with the correct person using two identifiers.  Location: Patient: Home Provider: [facility]   I discussed the limitations, risks, security and privacy concerns of performing an evaluation and management service by telephone and the availability of in person appointments. I also discussed with the patient that there may be a patient responsible charge related to this service. The patient expressed understanding and agreed to proceed.   History of Present Illness: I called and spoke with Christine Crawford this morning.  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  At the time of her initial visit with me in early March we agreed to have her start on weekly, prophylactic doses of fluconazole.  She has been doing that for the past 3 months and has not had any recurrences of her vaginitis symptoms.  She is tolerating fluconazole well.   Observations/Objective:   Assessment and Plan: She has had dramatic improvement on prophylactic fluconazole and we agree to continue it for at least 3 more months  Follow Up Instructions: Continue weekly fluconazole and follow-up here in 3 months   I discussed the assessment and treatment plan with the patient. The patient was provided an opportunity to ask questions and all were answered. The patient agreed with the plan and demonstrated an understanding of the instructions.   The patient was advised to call back or seek an in-person evaluation if the symptoms worsen or if the condition fails to improve as anticipated.  I provided 9 minutes of non-face-to-face time during this encounter.   Cliffton Asters, MD

## 2018-11-16 ENCOUNTER — Other Ambulatory Visit: Payer: Self-pay | Admitting: Family Medicine

## 2018-11-16 DIAGNOSIS — Z1231 Encounter for screening mammogram for malignant neoplasm of breast: Secondary | ICD-10-CM

## 2018-11-21 ENCOUNTER — Ambulatory Visit (INDEPENDENT_AMBULATORY_CARE_PROVIDER_SITE_OTHER): Payer: BC Managed Care – PPO | Admitting: Internal Medicine

## 2018-11-21 ENCOUNTER — Other Ambulatory Visit: Payer: Self-pay

## 2018-11-21 ENCOUNTER — Encounter: Payer: Self-pay | Admitting: Internal Medicine

## 2018-11-21 DIAGNOSIS — N760 Acute vaginitis: Secondary | ICD-10-CM | POA: Diagnosis not present

## 2018-11-21 NOTE — Progress Notes (Signed)
         [place] [facility]  Patient Active Problem List   Diagnosis Date Noted  . 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   VALACYCLOVIR (VALTREX) 500 MG TABLET    Take 500 mg by mouth as needed.  Modified Medications   No medications on file  Discontinued Medications   FLUCONAZOLE (DIFLUCAN) 100 MG TABLET    Take 1 tablet (100 mg total) by mouth once a week.   FLUCONAZOLE (DIFLUCAN) 150 MG TABLET    Take one tablet (150mg ) PO now, may repeat in 72 hours if symptoms still present.   FLUCONAZOLE (DIFLUCAN) 150 MG TABLET    Take one tablet every 72 hours for 3 doses then once weekly   METRONIDAZOLE (FLAGYL) 500 MG TABLET    Take 1 tablet (500 mg total) by mouth 2 (two) times daily.    Subjective: Christine Crawford is in for her routine follow-up visit. 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  At the time of her initial visit with me in early March we agreed to have her start on weekly, prophylactic doses of fluconazole.  She has been doing that and has not had any recurrences of her vaginitis symptoms.  She is tolerating fluconazole well.  Review of Systems: Review of Systems  Gastrointestinal: Negative for nausea and vomiting.    Past Medical History:  Diagnosis Date  . History of vaginal infection    Recurrents   . HSV-1 infection     Social History   Tobacco Use  . Smoking status: Never Smoker  . Smokeless tobacco: Never Used  Substance Use Topics  . Alcohol use: Yes    Alcohol/week: 1.0 standard drinks    Types: 1 Glasses of wine per week  . Drug use: No    Family History  Problem Relation Age of Onset  . Cancer Mother        Colon  . Heart disease Father   . Breast cancer Maternal  Aunt   . Breast cancer Paternal Aunt     No Known Allergies  Objective: Vitals:   11/21/18 1445  BP: 121/76  Pulse: (!) 58  Temp: 98.1 F (36.7 C)   There is no height or weight on file to calculate BMI.  Physical Exam Constitutional:      Comments: She appears comfortable and in good spirits.     Problem List Items Addressed This Visit      High   Recurrent vaginitis    Her pattern of recurrent vaginitis has been interrupted by weekly doses of fluconazole.  She says that she is ready to stop and see if her symptoms come back.  I instructed her to call or send a MyChart message if she has any signs or symptoms of relapse.          Michel Bickers, MD [facility] [phone] pager   [phone] cell 11/21/2018, 3:04 PM

## 2018-11-21 NOTE — Assessment & Plan Note (Signed)
Her pattern of recurrent vaginitis has been interrupted by weekly doses of fluconazole.  She says that she is ready to stop and see if her symptoms come back.  I instructed her to call or send a MyChart message if she has any signs or symptoms of relapse.

## 2018-12-01 ENCOUNTER — Other Ambulatory Visit: Payer: Self-pay

## 2018-12-01 ENCOUNTER — Ambulatory Visit
Admission: RE | Admit: 2018-12-01 | Discharge: 2018-12-01 | Disposition: A | Discharge status: Home / Self Care | Payer: BC Managed Care – PPO | Source: Ambulatory Visit

## 2018-12-01 DIAGNOSIS — Z1231 Encounter for screening mammogram for malignant neoplasm of breast: Secondary | ICD-10-CM

## 2018-12-12 ENCOUNTER — Telehealth: Payer: Self-pay

## 2018-12-12 ENCOUNTER — Other Ambulatory Visit: Payer: Self-pay

## 2018-12-12 MED ORDER — FLUCONAZOLE 100 MG PO TABS: 100.0000 mg | ORAL_TABLET | Freq: Every day | ORAL | 0 refills | Status: AC

## 2018-12-12 NOTE — Telephone Encounter (Signed)
Per Dr Megan Salon patient can restart fluconazole.   Fluconazole once daily for 7 days, then once weekly for 3 weeks.  F/u office visit  In 3 weeks.  Patient informed.   Laverle Patter, RN

## 2018-12-12 NOTE — Telephone Encounter (Signed)
Please send in refills and have her take 1 tablet daily for 7 days then switch to once weekly.  Also please arrange a follow-up visit in 2 to 3 months.  Thanks.

## 2018-12-12 NOTE — Telephone Encounter (Signed)
Message from voicemail:  Finished treatement in August , stopped and condition returned two days ago.   Laverle Patter, RN    Patient stopped fluconazole November 12, 2018 and vaginal discharge returned two days ago. Patient states she had a conversation about restarting fluconazole 150 mgs if symptoms returned and is requesting refill.  She was taking fluconazole once weekly and wonders if she should increase frequency this round.   Please advise.   Laverle Patter, RN

## 2018-12-28 ENCOUNTER — Ambulatory Visit (INDEPENDENT_AMBULATORY_CARE_PROVIDER_SITE_OTHER): Payer: BC Managed Care – PPO | Admitting: Internal Medicine

## 2018-12-28 ENCOUNTER — Encounter: Payer: Self-pay | Admitting: Internal Medicine

## 2018-12-28 ENCOUNTER — Other Ambulatory Visit: Payer: Self-pay

## 2018-12-28 DIAGNOSIS — N760 Acute vaginitis: Secondary | ICD-10-CM | POA: Diagnosis not present

## 2018-12-28 NOTE — Assessment & Plan Note (Signed)
Her chronic, recurrent vaginal discharge and itching responds well to oral fluconazole.  I recommended that she continue taking 100 mg weekly.  She will follow-up in 1 year.

## 2018-12-28 NOTE — Progress Notes (Signed)
         [place] for Infectious Disease  Patient Active Problem List   Diagnosis Date Noted  . 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   MULTIPLE VITAMINS-MINERALS (MULTIVITAMIN WOMEN PO)    Take 1 tablet by mouth daily.   VALACYCLOVIR (VALTREX) 500 MG TABLET    Take 500 mg by mouth as needed.  Modified Medications   No medications on file  Discontinued Medications   No medications on file    Subjective: Christine Crawford is in for her routine follow-up visit. 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  At the time of her initial visit with me in early March we agreed to have her start on weekly, prophylactic doses of fluconazole.    She did not have any further episodes of vaginitis while taking fluconazole.  She decided to stop it 1 month ago and had a prompt recurrence of symptoms.  She restarted fluconazole 2 weeks ago and her symptoms resolved.  Review of Systems: Review of Systems  Gastrointestinal: Negative for nausea and vomiting.    Past Medical History:  Diagnosis Date  . History of vaginal infection    Recurrents   . HSV-1 infection     Social History   Tobacco Use  . Smoking status: Never Smoker  . Smokeless tobacco: Never Used  Substance Use Topics  . Alcohol use: Yes    Alcohol/week: 1.0 standard drinks    Types: 1 Glasses of wine per week  . Drug use: No    Family History  Problem Relation Age of Onset  . Cancer Mother        Colon  . Heart disease Father   . Breast cancer Maternal Aunt   . Breast cancer Paternal Aunt     No Known Allergies  Objective: Vitals:   12/28/18 1006  BP: 116/74  Pulse: (!) 45  Temp: (!) 97.5 F (36.4 C)   There is no height or weight on file to calculate BMI.   Physical Exam Constitutional:      Comments: She appears comfortable and in good spirits.     Problem List Items Addressed This Visit      High   Recurrent vaginitis    Her chronic, recurrent vaginal discharge and itching responds well to oral fluconazole.  I recommended that she continue taking 100 mg weekly.  She will follow-up in 1 year.          Michel Bickers, MD [facility] for Infect[facility] [place] [facility] [phone] pager   [phone] cell 12/28/2018, 10:16 AM

## 2019-01-05 ENCOUNTER — Other Ambulatory Visit: Payer: Self-pay | Admitting: Internal Medicine

## 2019-01-05 NOTE — Telephone Encounter (Signed)
Refill request  fluconazole (DIFLUCAN) 100 MG tablet                     Sig: TAKE 1 TABLET(100 MG) BY MOUTH DAILY FOR 7 DAYS THEN TAKE 1 TABLET BY MOUTH 1 TIME WEEKLY             To be filled at: [id number] - [place],  - [address] Christine Crawford  

## 2019-01-06 ENCOUNTER — Telehealth: Payer: Self-pay

## 2019-01-06 NOTE — Telephone Encounter (Signed)
Refill request  fluconazole (DIFLUCAN) 100 MG tablet                     Sig: TAKE 1 TABLET(100 MG) BY MOUTH DAILY FOR 7 DAYS THEN TAKE 1 TABLET BY MOUTH 1 TIME WEEKLY             To be filled at: [place], [place] [address] AT [place]

## 2019-01-09 ENCOUNTER — Other Ambulatory Visit: Payer: Self-pay | Admitting: Internal Medicine

## 2019-01-09 MED ORDER — FLUCONAZOLE 100 MG PO TABS: 100.0000 mg | ORAL_TABLET | ORAL | 11 refills | Status: DC

## 2019-01-09 NOTE — Telephone Encounter (Signed)
I sent in a new prescriptions with a years worth of refills.

## 2019-03-21 ENCOUNTER — Other Ambulatory Visit: Payer: Self-pay

## 2019-03-28 NOTE — Progress Notes (Signed)
48 y.o. G29P2002 Married [place] female here for annual exam.    Skipping menses and lasts 3 - 4 days.  Last menses was October.  Menses this year were:  Jan, Feb, April, June, July, August, September, October, and maybe this week. Has some spotting a few days ago.  Night flashes.   Patient is taking Fluconazole 100 mg weekly prescribed through ID for recurrent yeast infection.  This is controlling her recurrent yeast infections.   Declines refills of Valtrex.   PCP:   Milagros Evener MD   LMP: 01/13/2019           Sexually active: Yes.    The current method of family planning is vasectomy.    Exercising: Yes.    Tennis  Smoker:  no  Health Maintenance: Pap: 04-02-16 Neg:Neg HR HPV.   History of abnormal Pap:  Did a cautery treatment in [place] 25 years ago.  MMG: 12-01-18 3D/Neg/density B/BiRads1 Colonoscopy: 12/2013 normal. BMD:   n/a  Result  n/a TDaP: Unsure Gardasil:   no JGG:[id number] blood Hep C:Donates blood Screening Labs:  Hb today: PCP.  Done this fall. Flu vaccine:  Completed.    reports that she has never smoked. She has never used smokeless tobacco. She reports current alcohol use of about 1.0 standard drinks of alcohol per week. She reports that she does not use drugs.  Past Medical History:  Diagnosis Date  . History of vaginal infection    Recurrents   . HSV-1 infection     Past Surgical History:  Procedure Laterality Date  . WISDOM TOOTH EXTRACTION      Current Outpatient Medications  Medication Sig Dispense Refill  . fluconazole (DIFLUCAN) 100 MG tablet Take 1 tablet (100 mg total) by mouth once a week. 4 tablet 11  . Multiple Vitamins-Minerals (MULTIVITAMIN WOMEN PO) Take 1 tablet by mouth daily.    . valACYclovir (VALTREX) 500 MG tablet Take 500 mg by mouth as needed.     No current facility-administered medications for this visit.    Family History  Problem Relation Age of Onset  . Cancer Mother        Colon  . Heart disease Father   .  Breast cancer Maternal Aunt   . Breast cancer Paternal Aunt     Review of Systems  All other systems reviewed and are negative.   Exam:   BP 132/68   Pulse 81   Temp (!) 97.5 F (36.4 C)   Ht 5\' 4"  (1.626 m)   Wt 148 lb (67.1 kg)   LMP 01/13/2019 (Exact Date)   SpO2 96%   BMI 25.40 kg/m     General appearance: alert, cooperative and appears stated age Head: normocephalic, without obvious abnormality, atraumatic Neck: no adenopathy, supple, symmetrical, trachea midline and thyroid normal to inspection and palpation Lungs: clear to auscultation bilaterally Breasts: normal appearance, no masses or tenderness, No nipple retraction or dimpling, No nipple discharge or bleeding, No axillary adenopathy Heart: regular rate and rhythm Abdomen: soft, non-tender; no masses, no organomegaly Extremities: extremities normal, atraumatic, no cyanosis or edema Skin: skin color, texture, turgor normal. No rashes or lesions Lymph nodes: cervical, supraclavicular, and axillary nodes normal. Neurologic: grossly normal  Pelvic: External genitalia:  no lesions              No abnormal inguinal nodes palpated.              Urethra:  normal appearing urethra with no masses, tenderness or lesions  Bartholins and Skenes: normal                 Vagina: normal appearing vagina with normal color and discharge, no lesions              Cervix: no lesions              Pap taken: No. Bimanual Exam:  Uterus:  normal size, contour, position, consistency, mobility, non-tender              Adnexa: no mass, fullness, tenderness              Rectal exam: Yes.  .  Confirms.              Anus:  normal sphincter tone, small sebaceous cyst at 4:00 just outside the anal opening.  Chaperone was present for exam.  Assessment:   Well woman visit with normal exam. Hx chronic vulvovaginitis.  On Fluconazole 100 mg weekly as prophylaxis.  Hx oral HSV. FH colon and breast cancer.   Plan: Mammogram  screening discussed. Self breast awareness reviewed. Pap and HR HPV as above. Guidelines for Calcium, Vitamin D, regular exercise program including cardiovascular and weight bearing exercise. She will check with her GI at [facility] about her next colonoscopy.  I discussed her sebaceous cyst.   Follow up annually and prn.   After visit summary provided.

## 2019-03-29 ENCOUNTER — Other Ambulatory Visit: Payer: Self-pay

## 2019-03-31 ENCOUNTER — Encounter: Payer: Self-pay | Admitting: Obstetrics and Gynecology

## 2019-03-31 ENCOUNTER — Ambulatory Visit (INDEPENDENT_AMBULATORY_CARE_PROVIDER_SITE_OTHER): Payer: BC Managed Care – PPO | Admitting: Obstetrics and Gynecology

## 2019-03-31 VITALS — BP 132/68 | HR 81 | Temp 97.5°F | Ht 64.0 in | Wt 148.0 lb

## 2019-03-31 DIAGNOSIS — Z01419 Encounter for gynecological examination (general) (routine) without abnormal findings: Secondary | ICD-10-CM | POA: Diagnosis not present

## 2019-03-31 NOTE — Patient Instructions (Signed)

## 2019-06-10 ENCOUNTER — Ambulatory Visit: Payer: BC Managed Care – PPO | Attending: Internal Medicine

## 2019-06-10 DIAGNOSIS — Z23 Encounter for immunization: Secondary | ICD-10-CM

## 2019-06-10 NOTE — Progress Notes (Signed)
   Covid-19 Vaccination Clinic  Name:  CHANEE HENRICKSON    MRN: [id number] DOB: November 03, 1970  06/10/2019  Ms. Cosper was observed post Covid-19 immunization for 15 minutes without incidence. She was provided with Vaccine Information Sheet and instruction to access the V-Safe system.   Ms. Barrick was instructed to call 911 with any severe reactions post vaccine: Marland Kitchen Difficulty breathing  . Swelling of your face and throat  . A fast heartbeat  . A bad rash all over your body  . Dizziness and weakness    Immunizations Administered    Name Date Dose VIS Date Route   Pfizer COVID-19 Vaccine 06/10/2019  6:09 PM 0.3 mL 03/24/2019 Intramuscular   Manufacturer: [employer], [employer]   Lot: [id number]   NDC: [address]-6

## 2019-07-01 ENCOUNTER — Ambulatory Visit: Payer: BC Managed Care – PPO | Attending: Internal Medicine

## 2019-07-01 DIAGNOSIS — Z23 Encounter for immunization: Secondary | ICD-10-CM

## 2019-07-01 NOTE — Progress Notes (Signed)
   Covid-19 Vaccination Clinic  Name:  Christine Crawford    MRN: [id number] DOB: 08-18-1970  07/01/2019  [name] was observed post Covid-19 immunization for 15 minutes without incident. She was provided with Vaccine Information Sheet and instruction to access the V-Safe system.   [name] was instructed to call 911 with any severe reactions post vaccine: Marland Kitchen Difficulty breathing  . Swelling of face and throat  . A fast heartbeat  . A bad rash all over body  . Dizziness and weakness   Immunizations Administered    Name Date Dose VIS Date Route   Pfizer COVID-19 Vaccine 07/01/2019  9:42 AM 0.3 mL 03/24/2019 Intramuscular   Manufacturer: [employer], [employer]   Lot: [id number]   NDC: [address]-1

## 2019-11-27 ENCOUNTER — Other Ambulatory Visit: Payer: Self-pay | Admitting: Family Medicine

## 2019-11-27 DIAGNOSIS — Z1231 Encounter for screening mammogram for malignant neoplasm of breast: Secondary | ICD-10-CM

## 2019-12-01 ENCOUNTER — Ambulatory Visit: Payer: BC Managed Care – PPO | Admitting: Family Medicine

## 2019-12-08 ENCOUNTER — Other Ambulatory Visit: Payer: Self-pay

## 2019-12-08 ENCOUNTER — Ambulatory Visit
Admission: RE | Admit: 2019-12-08 | Discharge: 2019-12-08 | Disposition: A | Discharge status: Home / Self Care | Payer: BC Managed Care – PPO | Source: Ambulatory Visit

## 2019-12-08 DIAGNOSIS — Z1231 Encounter for screening mammogram for malignant neoplasm of breast: Secondary | ICD-10-CM

## 2019-12-20 ENCOUNTER — Other Ambulatory Visit: Payer: Self-pay | Admitting: Internal Medicine

## 2019-12-20 DIAGNOSIS — N760 Acute vaginitis: Secondary | ICD-10-CM

## 2020-01-08 ENCOUNTER — Ambulatory Visit: Payer: BC Managed Care – PPO | Admitting: Internal Medicine

## 2020-01-30 ENCOUNTER — Ambulatory Visit (INDEPENDENT_AMBULATORY_CARE_PROVIDER_SITE_OTHER): Payer: BC Managed Care – PPO | Admitting: Internal Medicine

## 2020-01-30 ENCOUNTER — Other Ambulatory Visit: Payer: Self-pay

## 2020-01-30 ENCOUNTER — Encounter: Payer: Self-pay | Admitting: Internal Medicine

## 2020-01-30 DIAGNOSIS — N760 Acute vaginitis: Secondary | ICD-10-CM

## 2020-01-30 MED ORDER — FLUCONAZOLE 100 MG PO TABS: 100.0000 mg | ORAL_TABLET | Freq: Every day | ORAL | 5 refills | Status: DC

## 2020-01-30 NOTE — Progress Notes (Signed)
[facility]  Patient Active Problem List   Diagnosis Date Noted  . 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   VALACYCLOVIR (VALTREX) 500 MG TABLET    Take 500 mg by mouth as needed.  Modified Medications   Modified Medication Previous Medication   FLUCONAZOLE (DIFLUCAN) 100 MG TABLET fluconazole (DIFLUCAN) 100 MG tablet      Take 1 tablet (100 mg total) by mouth daily.    TAKE 1 TABLET(100 MG) BY MOUTH 1 TIME A WEEK  Discontinued Medications   No medications on file    Subjective: Ms. Christine Crawford is in for her routine follow-up visit. 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  At the time of her initial visit with me in early March of last year we agreed to have her start on weekly, prophylactic doses of fluconazole.    She did not have any further episodes of vaginitis while taking fluconazole.  She decided to stop it in August of last year and had a prompt recurrence of symptoms.  She restarted weekly fluconazole and has had not had a recurrence since that time.  She has not had any problems tolerating fluconazole.  She ran out of her refills last week.  Review of Systems: Review of Systems  Gastrointestinal: Negative for nausea and vomiting.  Genitourinary:       No vaginal itching or discharge.    Past Medical History:  Diagnosis Date  . History of vaginal infection    Recurrents   . HSV-1 infection     Social History   Tobacco Use  . Smoking status: Never Smoker  . Smokeless tobacco: Never Used  Vaping Use  . Vaping Use: Never used  Substance Use Topics  . Alcohol use: Yes    Alcohol/week: 1.0 standard drink    Types: 1 Glasses of wine per week  . Drug use: No    Family  History  Problem Relation Age of Onset  . Cancer Mother        Colon  . Heart disease Father   . Breast cancer Maternal Aunt   . Breast cancer Paternal Aunt     No Known Allergies  Objective: Vitals:   01/30/20 1456  BP: 138/83  Pulse: (!) 54  Temp: 98.3 F (36.8 C)  TempSrc: Oral  SpO2: 100%  Weight: 148 lb (67.1 kg)  Height: 5\' 3"  (1.6 m)   Body mass index is 26.22 kg/m.  Physical Exam Constitutional:      Comments: She is in good spirits.  Psychiatric:        Mood and Affect: Mood normal.     Problem List Items Addressed This Visit      High   Recurrent vaginitis    I discussed management options with her including staying off of fluconazole to see if she has recurrent vaginitis symptoms versus simply continuing weekly dosing of fluconazole.  She has decided to stay off of fluconazole for now to see how she does.  I have given her refills of fluconazole and instructed her to take 100 mg daily for 7 days if she has recurrent symptoms then go back to weekly dosing.  Relevant Medications   fluconazole (DIFLUCAN) 100 MG tablet       Cliffton Asters, MD [facility] for Infectious Disease [facility] [phone] pager   [phone] cell 01/30/2020, 3:11 PM

## 2020-01-30 NOTE — Assessment & Plan Note (Signed)
I discussed management options with her including staying off of fluconazole to see if she has recurrent vaginitis symptoms versus simply continuing weekly dosing of fluconazole.  She has decided to stay off of fluconazole for now to see how she does.  I have given her refills of fluconazole and instructed her to take 100 mg daily for 7 days if she has recurrent symptoms then go back to weekly dosing.

## 2020-02-05 ENCOUNTER — Ambulatory Visit: Payer: BC Managed Care – PPO | Admitting: Family Medicine

## 2020-04-15 NOTE — Progress Notes (Signed)
50 y.o. G34P2002 Married [place] female here for annual exam.    Seeing ID for recurrent yeast vaginitis.  Taking Diflucan weekly.  Symptoms are controlled.   Menses: Feb, Sept. 2021. Having hot flashes, mostly during the night.  Doing ok with the hot flashes.  No vaginal dryness.   Vaccinated against Covid and flu.   Working for [employer].   PCP:  Beverley Fiedler, MD   Patient's last menstrual period was 01/08/2020 (approximate).           Sexually active: Yes.    The current method of family planning is vasectomy.    Exercising: Yes.    pilates, tennis, cardio and some running Smoker:  no  Health Maintenance: Pap: 04-02-16 Neg:Neg HR HPV History of abnormal Pap:  Did a cautery treatment in [place] 25 years ago.  MMG: 12-08-19 3D/Neg/density B/Birads1 Colonoscopy:  12/2013 normal BMD:   n/a  Result  n/a TDaP: PCP Gardasil:   no HIV:no Hep C:no Screening Labs:  PCP   reports that she has never smoked. She has never used smokeless tobacco. She reports current alcohol use of about 1.0 standard drink of alcohol per week. She reports that she does not use drugs.  Past Medical History:  Diagnosis Date  . History of vaginal infection    Recurrents   . HSV-1 infection     Past Surgical History:  Procedure Laterality Date  . WISDOM TOOTH EXTRACTION      Current Outpatient Medications  Medication Sig Dispense Refill  . fluconazole (DIFLUCAN) 100 MG tablet Take 1 tablet (100 mg total) by mouth daily. 30 tablet 5  . Multiple Vitamins-Minerals (MULTIVITAMIN WOMEN PO) Take 1 tablet by mouth daily.    . valACYclovir (VALTREX) 500 MG tablet Take 500 mg by mouth as needed.     No current facility-administered medications for this visit.    Family History  Problem Relation Age of Onset  . Cancer Mother        Colon  . Heart disease Father   . Breast cancer Maternal Aunt   . Breast cancer Paternal Aunt     Review of Systems  All other systems reviewed and are  negative.   Exam:   BP 120/62   Pulse 60   Ht 5\' 4"  (1.626 m)   Wt 150 lb (68 kg)   LMP 01/08/2020 (Approximate)   SpO2 (!) 14%   BMI 25.75 kg/m     General appearance: alert, cooperative and appears stated age Head: normocephalic, without obvious abnormality, atraumatic Neck: no adenopathy, supple, symmetrical, trachea midline and thyroid normal to inspection and palpation Lungs: clear to auscultation bilaterally Breasts: normal appearance, no masses or tenderness, No nipple retraction or dimpling, No nipple discharge or bleeding, No axillary adenopathy Heart: regular rate and rhythm Abdomen: soft, non-tender; no masses, no organomegaly Extremities: extremities normal, atraumatic, no cyanosis or edema Skin: skin color, texture, turgor normal. No rashes or lesions Lymph nodes: cervical, supraclavicular, and axillary nodes normal. Neurologic: grossly normal  Pelvic: External genitalia:  no lesions              No abnormal inguinal nodes palpated.              Urethra:  normal appearing urethra with no masses, tenderness or lesions              Bartholins and Skenes: normal                 Vagina: normal appearing  vagina with normal color and discharge, no lesions              Cervix: no lesions              Pap taken: No. Bimanual Exam:  Uterus:  normal size, contour, position, consistency, mobility, non-tender              Adnexa: no mass, fullness, tenderness              Rectal exam: Yes.  .  Confirms.              Anus:  normal sphincter tone, no lesions  Chaperone was present for exam.  Assessment:   Well woman visit with normal exam. Hx chronic vulvovaginitis.  On Fluconazole 100 mg weekly as prophylaxis.  Hx oral HSV. FH colon and breast cancer.  Plan: Mammogram screening discussed. Self breast awareness reviewed. Pap and HR HPV as above. Guidelines for Calcium, Vitamin D, regular exercise program including cardiovascular and weight bearing exercise. She will  call if she needs refill of Valtrex.  Referral to GI for colonoscopy.  Follow up annually and prn.

## 2020-04-16 ENCOUNTER — Other Ambulatory Visit: Payer: Self-pay

## 2020-04-16 ENCOUNTER — Ambulatory Visit (INDEPENDENT_AMBULATORY_CARE_PROVIDER_SITE_OTHER): Payer: BC Managed Care – PPO | Admitting: Obstetrics and Gynecology

## 2020-04-16 ENCOUNTER — Encounter: Payer: Self-pay | Admitting: Obstetrics and Gynecology

## 2020-04-16 VITALS — BP 120/62 | HR 60 | Ht 64.0 in | Wt 150.0 lb

## 2020-04-16 DIAGNOSIS — Z1211 Encounter for screening for malignant neoplasm of colon: Secondary | ICD-10-CM

## 2020-04-16 DIAGNOSIS — Z01419 Encounter for gynecological examination (general) (routine) without abnormal findings: Secondary | ICD-10-CM | POA: Diagnosis not present

## 2020-04-16 DIAGNOSIS — Z800 Family history of malignant neoplasm of digestive organs: Secondary | ICD-10-CM | POA: Diagnosis not present

## 2020-04-16 NOTE — Patient Instructions (Signed)

## 2020-04-23 ENCOUNTER — Other Ambulatory Visit: Payer: Self-pay

## 2020-04-24 ENCOUNTER — Encounter: Payer: Self-pay | Admitting: Family Medicine

## 2020-04-24 ENCOUNTER — Ambulatory Visit (INDEPENDENT_AMBULATORY_CARE_PROVIDER_SITE_OTHER): Payer: BC Managed Care – PPO | Admitting: Family Medicine

## 2020-04-24 VITALS — BP 120/78 | HR 74 | Temp 98.4°F | Wt 148.4 lb

## 2020-04-24 DIAGNOSIS — N760 Acute vaginitis: Secondary | ICD-10-CM

## 2020-04-24 DIAGNOSIS — Z800 Family history of malignant neoplasm of digestive organs: Secondary | ICD-10-CM | POA: Diagnosis not present

## 2020-04-24 DIAGNOSIS — Z1159 Encounter for screening for other viral diseases: Secondary | ICD-10-CM

## 2020-04-24 DIAGNOSIS — I83813 Varicose veins of bilateral lower extremities with pain: Secondary | ICD-10-CM | POA: Insufficient documentation

## 2020-04-24 DIAGNOSIS — Z0000 Encounter for general adult medical examination without abnormal findings: Secondary | ICD-10-CM | POA: Diagnosis not present

## 2020-04-24 DIAGNOSIS — D500 Iron deficiency anemia secondary to blood loss (chronic): Secondary | ICD-10-CM | POA: Diagnosis not present

## 2020-04-24 DIAGNOSIS — N951 Menopausal and female climacteric states: Secondary | ICD-10-CM | POA: Insufficient documentation

## 2020-04-24 DIAGNOSIS — Z23 Encounter for immunization: Secondary | ICD-10-CM | POA: Diagnosis not present

## 2020-04-24 DIAGNOSIS — Z803 Family history of malignant neoplasm of breast: Secondary | ICD-10-CM | POA: Diagnosis not present

## 2020-04-24 DIAGNOSIS — D509 Iron deficiency anemia, unspecified: Secondary | ICD-10-CM | POA: Insufficient documentation

## 2020-04-24 LAB — LIPID PANEL
Cholesterol: 253 mg/dL — ABNORMAL HIGH (ref 0–200)
HDL: 95.6 mg/dL (ref >39.00)
LDL Cholesterol: 144 mg/dL — ABNORMAL HIGH (ref 0–99)
NonHDL: 157.64
Total CHOL/HDL Ratio: 3
Triglycerides: 66 mg/dL (ref 0.0–149.0)
VLDL: 13.2 mg/dL (ref 0.0–40.0)

## 2020-04-24 LAB — COMPREHENSIVE METABOLIC PANEL
ALT: 14 U/L (ref 0–35)
AST: 20 U/L (ref 0–37)
Albumin: 4.7 g/dL (ref 3.5–5.2)
Alkaline Phosphatase: 78 U/L (ref 39–117)
BUN: 20 mg/dL (ref 6–23)
CO2: 32 mEq/L (ref 19–32)
Calcium: 9.9 mg/dL (ref 8.4–10.5)
Chloride: 102 mEq/L (ref 96–112)
Creatinine, Ser: 0.83 mg/dL (ref 0.40–1.20)
GFR: 82.51 mL/min (ref >60.00)
Glucose, Bld: 83 mg/dL (ref 70–99)
Potassium: 3.5 mEq/L (ref 3.5–5.1)
Sodium: 139 mEq/L (ref 135–145)
Total Bilirubin: 0.4 mg/dL (ref 0.2–1.2)
Total Protein: 7.6 g/dL (ref 6.0–8.3)

## 2020-04-24 LAB — CBC WITH DIFFERENTIAL/PLATELET
Basophils Absolute: 0 10*3/uL (ref 0.0–0.1)
Basophils Relative: 0.6 % (ref 0.0–3.0)
Eosinophils Absolute: 0.1 10*3/uL (ref 0.0–0.7)
Eosinophils Relative: 2.1 % (ref 0.0–5.0)
HCT: 41.2 % (ref 36.0–46.0)
Hemoglobin: 13.7 g/dL (ref 12.0–15.0)
Lymphocytes Relative: 36.6 % (ref 12.0–46.0)
Lymphs Abs: 2.4 10*3/uL (ref 0.7–4.0)
MCHC: 33.3 g/dL (ref 30.0–36.0)
MCV: 89.3 fl (ref 78.0–100.0)
Monocytes Absolute: 0.5 10*3/uL (ref 0.1–1.0)
Monocytes Relative: 7 % (ref 3.0–12.0)
Neutro Abs: 3.5 10*3/uL (ref 1.4–7.7)
Neutrophils Relative %: 53.7 % (ref 43.0–77.0)
Platelets: 268 10*3/uL (ref 150.0–400.0)
RBC: 4.62 Mil/uL (ref 3.87–5.11)
RDW: 13.4 % (ref 11.5–15.5)
WBC: 6.4 10*3/uL (ref 4.0–10.5)

## 2020-04-24 LAB — TSH: TSH: 4.81 u[IU]/mL — ABNORMAL HIGH (ref 0.35–4.50)

## 2020-04-24 NOTE — Patient Instructions (Addendum)
Please return in 12 months for your annual complete physical; please come fasting.  I will release your lab results to you on your MyChart account with further instructions. Please reply with any questions.   Today you were given your Tdap vaccination. This is good for 10 years. Your last was given in 2011 so you were due.   It was a pleasure meeting you today! Thank you for choosing [place] to meet your healthcare needs! I truly look forward to working with you. If you have any questions or concerns, please send me a message via Mychart or call the office at [phone].   Calcium Intake Recommendations You can take Caltrate Plus twice a day or get it through your diet or other OTC supplements (Viactiv, OsCal etc)  Calcium is a mineral that affects many functions in the body, including:  Blood clotting.  Blood vessel function.  Nerve impulse conduction.  Hormone secretion.  Muscle contraction.  Bone and teeth functions.  Most of your body's calcium supply is stored in your bones and teeth. When your calcium stores are low, you may be at risk for low bone mass, bone loss, and bone fractures. Consuming enough calcium helps to grow healthy bones and teeth and to prevent breakdown over time. It is very important that you get enough calcium if you are:  A child undergoing rapid growth.  An adolescent girl.  A pre- or post-menopausal woman.  A woman whose menstrual cycle has stopped due to anorexia nervosa or regular intense exercise.  An individual with lactose intolerance or a milk allergy.  A vegetarian.  What is my plan? Try to consume the recommended amount of calcium daily based on your age. Depending on your overall health, your health care provider may recommend increased calcium intake.General daily calcium intake recommendations by age are:  Birth to 6 months: 200 mg.  Infants 7 to 12 months: 260 mg.  Children 1 to 3 years: 700 mg.  Children 4 to 8 years: 1,000  mg.  Children 9 to 13 years: 1,300 mg.  Teens 14 to 18 years: 1,300 mg.  Adults 19 to 50 years: 1,000 mg.  Adult women 51 to 70 years: 1,200 mg.  Adult men 51 to 70 years: 1,000 mg.  Adults 71 years and older: 1,200 mg.  Pregnant and breastfeeding teens: 1,300 mg.  Pregnant and breastfeeding adults: 1,000 mg.  What do I need to know about calcium intake?  In order for the body to absorb calcium, it needs vitamin D. You can get vitamin D through (we recommend getting [phone] units of Vitamin D daily) ? Direct exposure of the skin to sunlight. ? Foods, such as egg yolks, liver, saltwater fish, and fortified milk. ? Supplements.  Consuming too much calcium may cause: ? Constipation. ? Decreased absorption of iron and zinc. ? Kidney stones.  Calcium supplements may interact with certain medicines. Check with your health care provider before starting any calcium supplements.  Try to get most of your calcium from food. What foods can I eat? Grains  Fortified oatmeal. Fortified ready-to-eat cereals. Fortified frozen waffles. Vegetables Turnip greens. Broccoli. Fruits Fortified orange juice. Meats and Other Protein Sources Canned sardines with bones. Canned salmon with bones. Soy beans. Tofu. Baked beans. Almonds. [place] nuts. Sunflower seeds. Dairy Milk. Yogurt. Cheese. Cottage cheese. Beverages Fortified soy milk. Fortified rice milk. Sweets/Desserts Pudding. Ice Cream. Milkshakes. Blackstrap molasses. The items listed above may not be a complete list of recommended foods or beverages. Contact your  dietitian for more options. What foods can affect my calcium intake? It may be more difficult for your body to use calcium or calcium may leave your body more quickly if you consume large amounts of:  Sodium.  Protein.  Caffeine.  Alcohol.  This information is not intended to replace advice given to you by your health care provider. Make sure you discuss any  questions you have with your health care provider. Document Released: 11/12/2003 Document Revised: 10/18/2015 Document Reviewed: 09/05/2013 Elsevier Interactive Patient Education  2018 [employer].  Please do these things to maintain good health!   Exercise at least 30-45 minutes a day,  4-5 days a week.   Eat a low-fat diet with lots of fruits and vegetables, up to 7-9 servings per day.  Drink plenty of water daily. Try to drink 8 8oz glasses per day.  Seatbelts can save your life. Always wear your seatbelt.  Place Smoke Detectors on every level of your home and check batteries every year.  Schedule an appointment with an eye doctor for an eye exam every 1-2 years  Safe sex - use condoms to protect yourself from STDs if you could be exposed to these types of infections. Use birth control if you do not want to become pregnant and are sexually active.  Avoid heavy alcohol use. If you drink, keep it to less than 2 drinks/day and not every day.  Health Care Power of Attorney.  Choose someone you trust that could speak for you if you became unable to speak for yourself.  Depression is common in our stressful world.If you're feeling down or losing interest in things you normally enjoy, please come in for a visit.  If anyone is threatening or hurting you, please get help. Physical or Emotional Violence is never OK.

## 2020-04-24 NOTE — Progress Notes (Signed)
Subjective  Chief Complaint  Patient presents with  . New Patient (Initial Visit)    Previously seen by Dr. Barbaraann Barthel  . Health Maintenance    GYN sent referral to [facility] - waiting to be contacted to up NP appt    New pt:  HPI: Christine Crawford is a 50 y.o. female who presents to [facility] [place] today for a Female Wellness Visit. She also has the concerns and/or needs as listed above in the chief complaint. These will be addressed in addition to the Health Maintenance Visit.   Wellness Visit: annual visit with health maintenance review and exam without Pap Very pleasant 41 year old married mother of 2 children who works full-time.  Lives a very healthy lifestyle.  Originally from [place].  No major concerns and overall very healthy.  Here for physical   Health maintenance: Reviewed recent notes from GYN physical.  Mammogram and Pap smear both up-to-date.  Records reviewed.  He has colonoscopy referral in place.  Should be scheduled with GI.  Family history of colon cancer in her mother, high risk patient.  She has no symptoms.  Immunizations are up-to-date.  She is eligible for the booster.  Healthy lifestyle.  Tdap today Chronic disease f/u and/or acute problem visit: (deemed necessary to be done in addition to the wellness visit):  Sees infectious disease to manage recurrent vaginitis with weekly Diflucan.  This is doing better.  Has history of iron deficiency anemia.  She reports when was last checked anemia was resolved.  Related to menstrual cycles.  Her menstrual cycles are currently irregular and she is perimenopausal.  She has mild hot flashes.  She is managing well.  Distant history of breast cancer in the family.  Assessment  1. Annual physical exam   2. Recurrent vaginitis   3. Family history of colon cancer in mother   4. Family history of breast cancer   5. Iron deficiency anemia due to chronic blood loss   6. Need for hepatitis C screening test    7. Hot flushes, perimenopausal      Plan  Female Wellness Visit:  Age appropriate Health Maintenance and Prevention measures were discussed with patient. Included topics are cancer screening recommendations, ways to keep healthy (see AVS) including dietary and exercise recommendations, regular eye and dental care, use of seat belts, and avoidance of moderate alcohol use and tobacco use.  Colonoscopy to be scheduled.  Other screens are up-to-date.  BMI: discussed patient's BMI and encouraged positive lifestyle modifications to help get to or maintain a target BMI.  HM needs and immunizations were addressed and ordered. See below for orders. See HM and immunization section for updates.  Up-to-date  Routine labs and screening tests ordered including cmp, cbc and lipids where appropriate.  Discussed recommendations regarding Vit D and calcium supplementation (see AVS)  Chronic disease management visit and/or acute problem visit:  Recheck for anemia and iron deficiency. Follow up: 1 year for complete physical Orders Placed This Encounter  Procedures  . Tdap vaccine greater than or equal to 7yo IM  . CBC with Differential/Platelet  . Iron, TIBC and Ferritin Panel  . Lipid panel  . Comprehensive metabolic panel  . TSH  . Hepatitis C antibody   No orders of the defined types were placed in this encounter.     Lifestyle: Body mass index is 25.47 kg/m. Wt Readings from Last 3 Encounters:  04/24/20 148 lb 6.4 oz (67.3 kg)  04/16/20  150 lb (68 kg)  01/30/20 148 lb (67.1 kg)    Patient Active Problem List   Diagnosis Date Noted  . Family history of colon cancer in mother 05-05-20    Priority: High    Deceased age 15   . Family history of breast cancer 05-05-2020    Priority: Medium  . Hot flushes, perimenopausal 2020/05/05    Priority: Medium  . Recurrent vaginitis 03/03/2018    Priority: Medium    ID: treating with weekly diflucan   . Iron deficiency anemia  05-May-2020    Priority: Low  . Varicose veins of bilateral lower extremities with pain 05-05-2020    Priority: Low  . 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 COLONOSCOPY (Pts 45-76yrs Insurance coverage will need to be confirmed)  Never done  . COVID-19 Vaccine (3 - Booster for Pfizer series) 01/01/2020  . MAMMOGRAM  12/07/2020  . PAP SMEAR-Modifier  04/02/2021  . TETANUS/TDAP  05-05-2030  . INFLUENZA VACCINE  Completed  . Hepatitis C Screening  Completed  . HIV Screening  Discontinued   Immunization History  Administered Date(s) Administered  . Influenza,inj,Quad PF,6+ Mos 01/13/2013, 02/02/2017, 01/06/2018, 12/28/2018, 02/05/2020  . Influenza,inj,quad, With Preservative 02/02/2014, 02/19/2015, 12/05/2015  . PFIZER SARS-COV-2 Vaccination 06/10/2019, 07/01/2019  . Tdap 03/31/2010, 2020-05-05   We updated and reviewed the patient's past history in detail and it is documented below. Allergies: Patient has No Known Allergies. Past Medical History Patient  has a past medical history of History of vaginal infection and HSV-1 infection. Past Surgical History Patient  has a past surgical history that includes Wisdom tooth extraction. Family History: Patient family history includes Breast cancer in her maternal aunt and paternal aunt; Colon cancer in her mother; Healthy in her daughter, father, sister, and son; Hyperlipidemia in her brother; Stroke in an other family member. Social History:  Patient  reports that she has never smoked. She has never used smokeless tobacco. She reports current alcohol use of about 1.0 standard drink of alcohol per week. She reports that she does not use drugs.  Review of Systems: Constitutional: negative for fever or malaise Ophthalmic: negative for photophobia, double vision or loss of vision Cardiovascular: negative for chest pain, dyspnea on exertion, or new LE swelling Respiratory: negative for SOB  or persistent cough Gastrointestinal: negative for abdominal pain, change in bowel habits or melena Genitourinary: negative for dysuria or gross hematuria, no abnormal uterine bleeding or disharge Musculoskeletal: negative for new gait disturbance or muscular weakness Integumentary: negative for new or persistent rashes, no breast lumps Neurological: negative for TIA or stroke symptoms Psychiatric: negative for SI or delusions Allergic/Immunologic: negative for hives  Patient Care Team    Relationship Specialty Notifications Start End  Willow Ora, MD PCP - General Family Medicine  2020-05-05   Cliffton Asters, MD Consulting Physician Infectious Diseases  May 05, 2020   Patton Salles, MD Consulting Physician Obstetrics and Gynecology  05/05/20     Objective  Vitals: BP 120/78   Pulse 74   Temp 98.4 F (36.9 C) (Temporal)   Wt 148 lb 6.4 oz (67.3 kg)   SpO2 99%   BMI 25.47 kg/m  General:  Well developed, well nourished, no acute distress  Psych:  Alert and orientedx3,normal mood and affect HEENT:  Normocephalic, atraumatic, non-icteric sclera,  supple neck without adenopathy, mass or thyromegaly Cardiovascular:  Normal S1, S2, RRR without gallop, rub or murmur Respiratory:  Good breath sounds bilaterally, CTAB with normal respiratory effort Gastrointestinal: normal bowel sounds, soft, non-tender, no noted masses. No HSM MSK: no deformities, contusions. Joints are without erythema or swelling.  Skin:  Warm, no rashes or suspicious lesions noted Neurologic:    Mental status is normal. CN 2-11 are normal. Gross motor and sensory exams are normal. Normal gait. No tremor    Commons side effects, risks, benefits, and alternatives for medications and treatment plan prescribed today were discussed, and the patient expressed understanding of the given instructions. Patient is instructed to call or message via MyChart if he/she has any questions or concerns regarding our treatment  plan. No barriers to understanding were identified. We discussed Red Flag symptoms and signs in detail. Patient expressed understanding regarding what to do in case of urgent or emergency type symptoms.   Medication list was reconciled, printed and provided to the patient in AVS. Patient instructions and summary information was reviewed with the patient as documented in the AVS. This note was prepared with assistance of Dragon voice recognition software. Occasional wrong-word or sound-a-like substitutions may have occurred due to the inherent limitations of voice recognition software  This visit occurred during the SARS-CoV-2 public health emergency.  Safety protocols were in place, including screening questions prior to the visit, additional usage of staff PPE, and extensive cleaning of exam room while observing appropriate contact time as indicated for disinfecting solutions.

## 2020-04-25 LAB — HEPATITIS C ANTIBODY
Hepatitis C Ab: NONREACTIVE
SIGNAL TO CUT-OFF: 0.01 (ref <1.00)

## 2020-04-25 LAB — IRON,TIBC AND FERRITIN PANEL
%SAT: 15 % (calc) — ABNORMAL LOW (ref 16–45)
Ferritin: 14 ng/mL — ABNORMAL LOW (ref 16–232)
Iron: 61 ug/dL (ref 40–190)
TIBC: 397 mcg/dL (calc) (ref 250–450)

## 2020-04-26 ENCOUNTER — Telehealth: Payer: Self-pay

## 2020-04-26 ENCOUNTER — Other Ambulatory Visit (INDEPENDENT_AMBULATORY_CARE_PROVIDER_SITE_OTHER): Payer: BC Managed Care – PPO

## 2020-04-26 DIAGNOSIS — R7989 Other specified abnormal findings of blood chemistry: Secondary | ICD-10-CM

## 2020-04-26 LAB — T3, FREE: T3, Free: 3.1 pg/mL (ref 2.3–4.2)

## 2020-04-26 LAB — T4, FREE: Free T4: 0.61 ng/dL (ref 0.60–1.60)

## 2020-04-26 NOTE — Telephone Encounter (Signed)
Mychart result note sent.

## 2020-04-26 NOTE — Telephone Encounter (Signed)
Pt called about recent lab results. Pt wants to discuss high levels. Please advise.

## 2020-04-26 NOTE — Telephone Encounter (Signed)
Please advise 

## 2020-04-27 ENCOUNTER — Encounter: Payer: Self-pay | Admitting: Family Medicine

## 2020-04-30 ENCOUNTER — Encounter: Payer: Self-pay | Admitting: Family Medicine

## 2020-04-30 DIAGNOSIS — E038 Other specified hypothyroidism: Secondary | ICD-10-CM | POA: Insufficient documentation

## 2020-05-28 ENCOUNTER — Other Ambulatory Visit: Payer: Self-pay

## 2020-05-28 DIAGNOSIS — Z1211 Encounter for screening for malignant neoplasm of colon: Secondary | ICD-10-CM

## 2020-06-13 ENCOUNTER — Telehealth: Payer: Self-pay

## 2020-06-13 NOTE — Telephone Encounter (Signed)
Review of outside records shows a colonoscopy with Dr. Dorena Cookey 01/05/2014 for a family history of colon cancer in a first-degree relative was normal.  Surveillance recommended in 5 years.  Okay to schedule colonoscopy.

## 2020-06-13 NOTE — Telephone Encounter (Signed)
Hey Dr Orvan Falconer, this pt is being referred to [place] for a colonsocopy from [facility] but it looks like the pt had a colonoscopy done on 2015, I will send the report to you for review, please advise on scheduling.

## 2020-06-13 NOTE — Telephone Encounter (Signed)
I will look for those records.  

## 2020-06-14 ENCOUNTER — Encounter: Payer: Self-pay | Admitting: Gastroenterology

## 2020-07-10 ENCOUNTER — Other Ambulatory Visit: Payer: Self-pay

## 2020-07-10 ENCOUNTER — Ambulatory Visit (AMBULATORY_SURGERY_CENTER): Payer: Self-pay

## 2020-07-10 VITALS — Ht 64.0 in | Wt 143.0 lb

## 2020-07-10 DIAGNOSIS — Z800 Family history of malignant neoplasm of digestive organs: Secondary | ICD-10-CM

## 2020-07-10 MED ORDER — SUTAB 1479-225-188 MG PO TABS: 12.0000 | ORAL_TABLET | ORAL | 0 refills | Status: DC

## 2020-07-10 NOTE — Progress Notes (Signed)
No allergies to soy or egg Pt is not on blood thinners or diet pills Issues with sedation/intubation not easily assessed-has only had colonoscopy prior and wisdom teeth extraction. Denies atrial flutter/fib Denies constipation    Pt is aware of Covid safety and care partner requirements.

## 2020-07-11 ENCOUNTER — Telehealth: Payer: Self-pay | Admitting: Gastroenterology

## 2020-07-11 NOTE — Telephone Encounter (Signed)
No Liquid prep covered by [facility]- changer her to Miralax- sent in My Chart instructions- will mail as well- called pt and discussed with her the changes

## 2020-07-11 NOTE — Telephone Encounter (Signed)
Pt prefers to use prep in liquid form. She stated that she will not be able to do pills.

## 2020-08-02 ENCOUNTER — Ambulatory Visit (AMBULATORY_SURGERY_CENTER): Payer: BC Managed Care – PPO | Admitting: Gastroenterology

## 2020-08-02 ENCOUNTER — Other Ambulatory Visit: Payer: Self-pay

## 2020-08-02 ENCOUNTER — Encounter: Payer: Self-pay | Admitting: Gastroenterology

## 2020-08-02 VITALS — BP 138/68 | HR 58 | Temp 98.2°F | Resp 12 | Ht 64.0 in | Wt 143.0 lb

## 2020-08-02 DIAGNOSIS — Z1211 Encounter for screening for malignant neoplasm of colon: Secondary | ICD-10-CM | POA: Diagnosis not present

## 2020-08-02 DIAGNOSIS — Z800 Family history of malignant neoplasm of digestive organs: Secondary | ICD-10-CM | POA: Diagnosis not present

## 2020-08-02 MED ORDER — SODIUM CHLORIDE 0.9 % IV SOLN: 500.0000 mL | Freq: Once | INTRAVENOUS | Status: DC

## 2020-08-02 NOTE — Op Note (Signed)
[facility] Patient Name: Christine Crawford Mara Procedure Date: 08/02/2020 2:32 PM MRN: [id number] Endoscopist: Tressia DanasKimberly Damiyah Ditmars MD, MD Age: 50 Referring MD:  Date of Birth: 1971/03/27 Gender: Female Account #: [id number] Procedure:                Colonoscopy Indications:              Screening in patient at increased risk: Family                            history of 1st-degree relative with colorectal                            cancer                           Mother with colon cancer in her 9760s Medicines:                Monitored Anesthesia Care Procedure:                Pre-Anesthesia Assessment:                           - Prior to the procedure, a History and Physical                            was performed, and patient medications and                            allergies were reviewed. The patient's tolerance of                            previous anesthesia was also reviewed. The risks                            and benefits of the procedure and the sedation                            options and risks were discussed with the patient.                            All questions were answered, and informed consent                            was obtained. Prior Anticoagulants: The patient has                            taken no previous anticoagulant or antiplatelet                            agents. ASA Grade Assessment: I - A normal, healthy                            patient. After reviewing the risks and benefits,  the patient was deemed in satisfactory condition to                            undergo the procedure.                           After obtaining informed consent, the colonoscope                            was passed under direct vision. Throughout the                            procedure, the patient's blood pressure, pulse, and                            oxygen saturations were monitored continuously. The                             Olympus CF-HQ190L (Serial# [id number]) Colonoscope was                            introduced through the anus and advanced to the 3                            cm into the ileum. A second forward view of the                            right colon was performed. The colonoscopy was                            performed without difficulty. The patient tolerated                            the procedure well. The quality of the bowel                            preparation was good. The terminal ileum, ileocecal                            valve, appendiceal orifice, and rectum were                            photographed. Scope In: 2:36:19 PM Scope Out: 2:49:54 PM Scope Withdrawal Time: 0 hours 9 minutes 5 seconds  Total Procedure Duration: 0 hours 13 minutes 35 seconds  Findings:                 The perianal and digital rectal examinations were                            normal.                           Small, non-bleeding internal hemorrhoids were found.  Anal papilla(e) were hypertrophied.                           Multiple small and large-mouthed diverticula were                            found in the entire colon. The diverticulosis is                            most pronounced in the right colon.                           The is diffuse melanosis coli throughout the colon.                            The exam was otherwise without abnormality on                            direct and retroflexion views. Complications:            No immediate complications. Estimated Blood Loss:     Estimated blood loss: none. Impression:               - Small, non-bleeding internal hemorrhoids.                           - Anal papilla(e) were hypertrophied.                           - Diverticulosis in the entire examined colon.                           - Mild diffuse melanosis coli.                           - The examination was otherwise normal on direct                             and retroflexion views.                           - No specimens collected. Recommendation:           - Patient has a contact number available for                            emergencies. The signs and symptoms of potential                            delayed complications were discussed with the                            patient. Return to normal activities tomorrow.                            Written discharge instructions were provided to the  patient.                           - Resume previous diet.                           - Continue present medications.                           - Repeat colonoscopy in 5 years for surveillance.                           - Emerging evidence supports eating a diet of                            fruits, vegetables, grains, calcium, and yogurt                            while reducing red meat and alcohol may reduce the                            risk of colon cancer.                           - Thank you for allowing me to be involved in your                            colon cancer prevention. Tressia Danas MD, MD 08/02/2020 2:57:57 PM This report has been signed electronically.

## 2020-08-02 NOTE — Progress Notes (Signed)
Report given to PACU, vss 

## 2020-08-02 NOTE — Patient Instructions (Signed)
YOU HAD AN ENDOSCOPIC PROCEDURE TODAY AT THE [facility]:   Refer to the procedure report that was given to you for any specific questions about what was found during the examination.  If the procedure report does not answer your questions, please call your gastroenterologist to clarify.  If you requested that your care partner not be given the details of your procedure findings, then the procedure report has been included in a sealed envelope for you to review at your convenience later.  YOU SHOULD EXPECT: Some feelings of bloating in the abdomen. Passage of more gas than usual.  Walking can help get rid of the air that was put into your GI tract during the procedure and reduce the bloating. If you had a lower endoscopy (such as a colonoscopy or flexible sigmoidoscopy) you may notice spotting of blood in your stool or on the toilet paper. If you underwent a bowel prep for your procedure, you may not have a normal bowel movement for a few days.  Please Note:  You might notice some irritation and congestion in your nose or some drainage.  This is from the oxygen used during your procedure.  There is no need for concern and it should clear up in a day or so.  SYMPTOMS TO REPORT IMMEDIATELY:   Following lower endoscopy (colonoscopy or flexible sigmoidoscopy):  Excessive amounts of blood in the stool  Significant tenderness or worsening of abdominal pains  Swelling of the abdomen that is new, acute  Fever of 100F or higher   Following upper endoscopy (EGD)  Vomiting of blood or coffee ground material  New chest pain or pain under the shoulder blades  Painful or persistently difficult swallowing  New shortness of breath  Fever of 100F or higher  Black, tarry-looking stools  For urgent or emergent issues, a gastroenterologist can be reached at any hour by calling [phone]. Do not use MyChart messaging for urgent concerns.    DIET:  We do recommend a small meal at first, but  then you may proceed to your regular diet.  Drink plenty of fluids but you should avoid alcoholic beverages for 24 hours.  ACTIVITY:  You should plan to take it easy for the rest of today and you should NOT DRIVE or use heavy machinery until tomorrow (because of the sedation medicines used during the test).    FOLLOW UP: Our staff will call the number listed on your records 48-72 hours following your procedure to check on you and address any questions or concerns that you may have regarding the information given to you following your procedure. If we do not reach you, we will leave a message.  We will attempt to reach you two times.  During this call, we will ask if you have developed any symptoms of COVID 19. If you develop any symptoms (ie: fever, flu-like symptoms, shortness of breath, cough etc.) before then, please call [phone].  If you test positive for Covid 19 in the 2 weeks post procedure, please call and report this information to us.    If any biopsies were taken you will be contacted by phone or by letter within the next 1-3 weeks.  Please call us at [phone] if you have not heard about the biopsies in 3 weeks.    SIGNATURES/CONFIDENTIALITY: You and/or your care partner have signed paperwork which will be entered into your electronic medical record.  These signatures attest to the fact that that the information above on   your After Visit Summary has been reviewed and is understood.  Full responsibility of the confidentiality of this discharge information lies with you and/or your care-partner. 

## 2020-08-06 ENCOUNTER — Telehealth: Payer: Self-pay

## 2020-08-06 NOTE — Telephone Encounter (Signed)
  Follow up Call-  Call back number 08/02/2020  Post procedure Call Back phone  # [phone]  Permission to leave phone message Yes  Some recent data might be hidden     Patient questions:  Do you have a fever, pain , or abdominal swelling? No. Pain Score  0 *  Have you tolerated food without any problems? Yes.    Have you been able to return to your normal activities? Yes.    Do you have any questions about your discharge instructions: Diet   No. Medications  No. Follow up visit  No.  Do you have questions or concerns about your Care? No.  Actions: * If pain score is 4 or above: No action needed, pain <4.  1. Have you developed a fever since your procedure? no  2.   Have you had an respiratory symptoms (SOB or cough) since your procedure? no  3.   Have you tested positive for COVID 19 since your procedure no  4.   Have you had any family members/close contacts diagnosed with the COVID 19 since your procedure?  no   If yes to any of these questions please route to Laverna Peace, RN and Karlton Lemon, RN

## 2020-11-11 ENCOUNTER — Other Ambulatory Visit: Payer: Self-pay | Admitting: Family Medicine

## 2020-11-11 DIAGNOSIS — Z1231 Encounter for screening mammogram for malignant neoplasm of breast: Secondary | ICD-10-CM

## 2020-12-30 ENCOUNTER — Ambulatory Visit
Admission: RE | Admit: 2020-12-30 | Discharge: 2020-12-30 | Disposition: A | Discharge status: Home / Self Care | Payer: BC Managed Care – PPO | Source: Ambulatory Visit

## 2020-12-30 ENCOUNTER — Other Ambulatory Visit: Payer: Self-pay

## 2020-12-30 DIAGNOSIS — Z1231 Encounter for screening mammogram for malignant neoplasm of breast: Secondary | ICD-10-CM

## 2021-04-02 ENCOUNTER — Telehealth: Payer: Self-pay

## 2021-04-02 NOTE — Telephone Encounter (Signed)
Patient has dropped off physician form to be completed.   Please call once completed.   Placed in Andy's box.

## 2021-04-03 NOTE — Telephone Encounter (Signed)
Noted  

## 2021-04-22 ENCOUNTER — Encounter: Payer: Self-pay | Admitting: Physician Assistant

## 2021-04-22 ENCOUNTER — Ambulatory Visit (INDEPENDENT_AMBULATORY_CARE_PROVIDER_SITE_OTHER): Payer: BC Managed Care – PPO | Admitting: Physician Assistant

## 2021-04-22 ENCOUNTER — Other Ambulatory Visit: Payer: Self-pay

## 2021-04-22 VITALS — BP 110/68 | HR 67 | Temp 97.9°F | Ht 64.0 in | Wt 153.0 lb

## 2021-04-22 DIAGNOSIS — R051 Acute cough: Secondary | ICD-10-CM | POA: Diagnosis not present

## 2021-04-22 MED ORDER — PREDNISONE 20 MG PO TABS: 40.0000 mg | ORAL_TABLET | Freq: Every day | ORAL | 0 refills | Status: DC

## 2021-04-22 NOTE — Patient Instructions (Addendum)
It was great to see you!  You have a viral upper respiratory infection. Antibiotics are not needed for this.  Viral infections usually take 7-10 days to resolve.  The cough can last a few weeks to go away.  Use medication as prescribed: oral prednisone  Please consider purchasing 12-hours dextromethorphan polistirex ER (see below)    Push fluids and get plenty of rest. Please return if you are not improving as expected, or if you have high fevers (>101.5) or difficulty swallowing or worsening productive cough.  Call clinic with questions.  I hope you start feeling better soon!

## 2021-04-22 NOTE — Progress Notes (Signed)
Christine Crawford is a 51 y.o. female here for cough.  History of Present Illness:   Chief Complaint  Patient presents with   Cough    Christine Crawford is c/o cough since 12/30. Christine Crawford is having pain with coughing. Christine Crawford was using OTC Mucinex. Nasal congestion. Denies fever or chills. Last COVID test was 04/14/21 Negative.    HPI  Cough Christine Crawford presents with c/o cough that has been onset for about 2 weeks. Additionally Christine Crawford has also been experiencing nasal congestion for the same amount of time. Following Christine Crawford onset of sx, Christine Crawford tested herself for covid via at home test on 04/12/21 and 04/16/21, but both results were negative. Christine Crawford was using OTC mucinex to manage Christine Crawford sx, but was provided with no relief. Christine Crawford has not trialed any additional medication for relief. Currently Christine Crawford reports Christine Crawford is having rib pain caused by Christine Crawford cough and believes it is getting worse as time goes on. Denies fever, chills, hx of tobacco use, or LE swelling.   Past Medical History:  Diagnosis Date   History of vaginal infection    Recurrents    HSV-1 infection      Social History   Tobacco Use   Smoking status: Never   Smokeless tobacco: Never  Vaping Use   Vaping Use: Never used  Substance Use Topics   Alcohol use: Yes    Alcohol/week: 1.0 standard drink    Types: 1 Glasses of wine per week   Drug use: No    Past Surgical History:  Procedure Laterality Date   WISDOM TOOTH EXTRACTION      Family History  Problem Relation Age of Onset   Colon cancer Mother 98   Healthy Father    Breast cancer Maternal Aunt    Breast cancer Paternal Aunt    Healthy Sister    Hyperlipidemia Brother    Healthy Daughter    Healthy Son    Stroke Other    Depression Neg Hx    Diabetes Neg Hx    Dementia Neg Hx    Colon polyps Neg Hx    Rectal cancer Neg Hx    Stomach cancer Neg Hx     No Known Allergies  Current Medications:   Current Outpatient Medications:    fluconazole (DIFLUCAN) 100 MG tablet, Take 1 tablet (100 mg total) by  mouth daily. (Patient taking differently: Take 100 mg by mouth once a week.), Disp: 30 tablet, Rfl: 5   Multiple Vitamins-Minerals (MULTIVITAMIN WOMEN PO), Take 1 tablet by mouth daily., Disp: , Rfl:    predniSONE (DELTASONE) 20 MG tablet, Take 2 tablets (40 mg total) by mouth daily., Disp: 10 tablet, Rfl: 0   valACYclovir (VALTREX) 500 MG tablet, Take 2,000 mg by mouth in the morning and at bedtime. As needed for cold sore, Disp: , Rfl:    Review of Systems:   ROS Negative unless otherwise specified per HPI.  Vitals:   Vitals:   04/22/21 1302  BP: 110/68  Pulse: 67  Temp: 97.9 F (36.6 C)  TempSrc: Temporal  SpO2: 98%  Weight: 153 lb (69.4 kg)  Height: 5\' 4"  (1.626 m)     Body mass index is 26.26 kg/m.  Physical Exam:   Physical Exam Vitals and nursing note reviewed.  Constitutional:      General: Christine Crawford is not in acute distress.    Appearance: Christine Crawford is well-developed. Christine Crawford is not ill-appearing or toxic-appearing.  HENT:     Head: Normocephalic and atraumatic.  Right Ear: Hearing, tympanic membrane, ear canal and external ear normal. Tympanic membrane is not erythematous, retracted or bulging.     Left Ear: Hearing, tympanic membrane, ear canal and external ear normal. Tympanic membrane is not erythematous, retracted or bulging.     Nose: Nose normal.     Right Sinus: No maxillary sinus tenderness or frontal sinus tenderness.     Left Sinus: No maxillary sinus tenderness or frontal sinus tenderness.     Mouth/Throat:     Pharynx: Uvula midline. No posterior oropharyngeal erythema.  Eyes:     General: Lids are normal.     Conjunctiva/sclera: Conjunctivae normal.  Neck:     Trachea: Trachea normal.  Cardiovascular:     Rate and Rhythm: Normal rate and regular rhythm.     Pulses: Normal pulses.     Heart sounds: Normal heart sounds, S1 normal and S2 normal.  Pulmonary:     Effort: Pulmonary effort is normal.     Breath sounds: Normal breath sounds. No decreased breath  sounds, wheezing, rhonchi or rales.  Lymphadenopathy:     Cervical: No cervical adenopathy.  Skin:    General: Skin is warm and dry.  Neurological:     Mental Status: Christine Crawford is alert.     GCS: GCS eye subscore is 4. GCS verbal subscore is 5. GCS motor subscore is 6.  Psychiatric:        Speech: Speech normal.        Behavior: Behavior normal. Behavior is cooperative.    Assessment and Plan:   Acute Cough Christine Crawford is in NAD without any obvious signs/symptoms of significant infection Suspect viral illness with post-viral cough Start prednisone 40 mg daily x 5 days Recommended patient use OTC 12 hour Dextromethorphan for additional relief Encouraged patient to push more fluids Follow up if worsening/no improvement of symptoms occurs  I,Havlyn C Ratchford,acting as a Neurosurgeon for [employer], PA.,have documented all relevant documentation on the behalf of Jarold Motto, PA,as directed by  Jarold Motto, PA while in the presence of Jarold Motto, [place].  I, Jarold Motto, [place], have reviewed all documentation for this visit. The documentation on 04/22/21 for the exam, diagnosis, procedures, and orders are all accurate and complete.   Jarold Motto, PA-C

## 2021-04-25 ENCOUNTER — Encounter: Payer: Self-pay | Admitting: Family Medicine

## 2021-04-25 ENCOUNTER — Ambulatory Visit (INDEPENDENT_AMBULATORY_CARE_PROVIDER_SITE_OTHER): Payer: BC Managed Care – PPO | Admitting: Family Medicine

## 2021-04-25 ENCOUNTER — Other Ambulatory Visit: Payer: Self-pay

## 2021-04-25 VITALS — BP 120/90 | HR 50 | Temp 98.2°F | Ht 64.0 in | Wt 152.4 lb

## 2021-04-25 DIAGNOSIS — N951 Menopausal and female climacteric states: Secondary | ICD-10-CM

## 2021-04-25 DIAGNOSIS — Z800 Family history of malignant neoplasm of digestive organs: Secondary | ICD-10-CM | POA: Diagnosis not present

## 2021-04-25 DIAGNOSIS — N760 Acute vaginitis: Secondary | ICD-10-CM | POA: Diagnosis not present

## 2021-04-25 DIAGNOSIS — J069 Acute upper respiratory infection, unspecified: Secondary | ICD-10-CM

## 2021-04-25 DIAGNOSIS — E039 Hypothyroidism, unspecified: Secondary | ICD-10-CM

## 2021-04-25 DIAGNOSIS — E038 Other specified hypothyroidism: Secondary | ICD-10-CM | POA: Diagnosis not present

## 2021-04-25 DIAGNOSIS — Z803 Family history of malignant neoplasm of breast: Secondary | ICD-10-CM | POA: Diagnosis not present

## 2021-04-25 DIAGNOSIS — Z0000 Encounter for general adult medical examination without abnormal findings: Secondary | ICD-10-CM

## 2021-04-25 DIAGNOSIS — E611 Iron deficiency: Secondary | ICD-10-CM | POA: Insufficient documentation

## 2021-04-25 LAB — LIPID PANEL
Cholesterol: 250 mg/dL — ABNORMAL HIGH (ref 0–200)
HDL: 84.5 mg/dL (ref >39.00)
LDL Cholesterol: 152 mg/dL — ABNORMAL HIGH (ref 0–99)
NonHDL: 165.64
Total CHOL/HDL Ratio: 3
Triglycerides: 68 mg/dL (ref 0.0–149.0)
VLDL: 13.6 mg/dL (ref 0.0–40.0)

## 2021-04-25 LAB — COMPREHENSIVE METABOLIC PANEL
ALT: 14 U/L (ref 0–35)
AST: 17 U/L (ref 0–37)
Albumin: 4.4 g/dL (ref 3.5–5.2)
Alkaline Phosphatase: 75 U/L (ref 39–117)
BUN: 20 mg/dL (ref 6–23)
CO2: 29 mEq/L (ref 19–32)
Calcium: 9.6 mg/dL (ref 8.4–10.5)
Chloride: 103 mEq/L (ref 96–112)
Creatinine, Ser: 0.8 mg/dL (ref 0.40–1.20)
GFR: 85.63 mL/min (ref >60.00)
Glucose, Bld: 124 mg/dL — ABNORMAL HIGH (ref 70–99)
Potassium: 4.6 mEq/L (ref 3.5–5.1)
Sodium: 141 mEq/L (ref 135–145)
Total Bilirubin: 0.4 mg/dL (ref 0.2–1.2)
Total Protein: 7.1 g/dL (ref 6.0–8.3)

## 2021-04-25 LAB — CBC WITH DIFFERENTIAL/PLATELET
Basophils Absolute: 0 10*3/uL (ref 0.0–0.1)
Basophils Relative: 0.1 % (ref 0.0–3.0)
Eosinophils Absolute: 0 10*3/uL (ref 0.0–0.7)
Eosinophils Relative: 0 % (ref 0.0–5.0)
HCT: 41.5 % (ref 36.0–46.0)
Hemoglobin: 13.6 g/dL (ref 12.0–15.0)
Lymphocytes Relative: 13.1 % (ref 12.0–46.0)
Lymphs Abs: 1.2 10*3/uL (ref 0.7–4.0)
MCHC: 32.8 g/dL (ref 30.0–36.0)
MCV: 89.3 fl (ref 78.0–100.0)
Monocytes Absolute: 0.1 10*3/uL (ref 0.1–1.0)
Monocytes Relative: 1.4 % — ABNORMAL LOW (ref 3.0–12.0)
Neutro Abs: 8 10*3/uL — ABNORMAL HIGH (ref 1.4–7.7)
Neutrophils Relative %: 85.4 % — ABNORMAL HIGH (ref 43.0–77.0)
Platelets: 293 10*3/uL (ref 150.0–400.0)
RBC: 4.65 Mil/uL (ref 3.87–5.11)
RDW: 13.8 % (ref 11.5–15.5)
WBC: 9.4 10*3/uL (ref 4.0–10.5)

## 2021-04-25 LAB — T4, FREE: Free T4: 0.72 ng/dL (ref 0.60–1.60)

## 2021-04-25 LAB — TSH: TSH: 1.79 u[IU]/mL (ref 0.35–5.50)

## 2021-04-25 MED ORDER — FLUCONAZOLE 100 MG PO TABS: 100.0000 mg | ORAL_TABLET | ORAL | Status: DC

## 2021-04-25 MED ORDER — VALACYCLOVIR HCL 500 MG PO TABS: 2000.0000 mg | ORAL_TABLET | Freq: Two times a day (BID) | ORAL | 2 refills | Status: DC

## 2021-04-25 MED ORDER — BENZONATATE 200 MG PO CAPS: 200.0000 mg | ORAL_CAPSULE | Freq: Two times a day (BID) | ORAL | 0 refills | Status: DC | PRN

## 2021-04-25 MED ORDER — GUAIFENESIN-CODEINE 100-10 MG/5ML PO SOLN: 5.0000 mL | Freq: Every evening | ORAL | 0 refills | Status: DC | PRN

## 2021-04-25 NOTE — Patient Instructions (Signed)
Please return in 12 months for your annual complete physical; please come fasting.   I will release your lab results to you on your MyChart account with further instructions. Please reply with any questions.    You can consider black cohosh for hot flushes and/or valerian root to help you sleep.   If you have any questions or concerns, please don't hesitate to send me a message via MyChart or call the office at [phone]. Thank you for visiting with [place] today! It's our pleasure caring for you.   Please do these things to maintain good health!  Exercise at least 30-45 minutes a day,  4-5 days a week.  Eat a low-fat diet with lots of fruits and vegetables, up to 7-9 servings per day. Drink plenty of water daily. Try to drink 8 8oz glasses per day. Seatbelts can save your life. Always wear your seatbelt. Place Smoke Detectors on every level of your home and check batteries every year. Schedule an appointment with an eye doctor for an eye exam every 1-2 years Safe sex - use condoms to protect yourself from STDs if you could be exposed to these types of infections. Use birth control if you do not want to become pregnant and are sexually active. Avoid heavy alcohol use. If you drink, keep it to less than 2 drinks/day and not every day. Health Care Power of Attorney.  Choose someone you trust that could speak for you if you became unable to speak for yourself. Depression is common in our stressful world.If you're feeling down or losing interest in things you normally enjoy, please come in for a visit. If anyone is threatening or hurting you, please get help. Physical or Emotional Violence is never OK.

## 2021-04-25 NOTE — Progress Notes (Signed)
Subjective  Chief Complaint  Patient presents with   Annual Exam    Non fasting    HPI: Christine Crawford is a 50 y.o. female who presents to [place] [facility] today for a Female Wellness Visit. She also has the concerns and/or needs as listed above in the chief complaint. These will be addressed in addition to the Health Maintenance Visit.   Wellness Visit: annual visit with health maintenance review and exam without Pap  Health maintenance: 51 year old female, sees GYN next week for female wellness exam, mammogram and Pap smear.  Reviewed recent colonoscopy from April 2022.  Normal.  Family history of colon cancer and get screened every 5 years.  Immunizations are all current.  She lives a healthy lifestyle, works full-time, exercises regularly. Chronic disease f/u and/or acute problem visit: (deemed necessary to be done in addition to the wellness visit): Recurrent vaginitis: Diflucan 100 mg weekly per ID.  Has questions about length of treatment. Menopausal hot flashes: LMP over a year ago.  Hot flashes do disrupt sleep.  Has mild disruption of sleep outside of hot flashes.  Using over-the-counter supplement that is helpful.  Has GYN visit next week. History of subclinical hypothyroidism: Remains asymptomatic.  No symptoms of low thyroid. History of iron deficiency anemia due to menstrual blood loss.  Last year had mild iron deficiency without anemia.  Since, has not had any menstrual cycles.  Did not take supplements.  Feels well Reviewed recent acute care visit for cough: Diagnosed with viral upper respiratory infection.  Treated with supportive cough medications.  Prednisone.  Mildly improving.  Still with problems sleeping at night.  No new symptoms.  No fevers.  No productive cough.  No shortness of breath.  COVID testing was negative.  Assessment  1. Annual physical exam   2. Family history of colon cancer in mother   49. Family history of breast cancer   4.  Recurrent vaginitis   5. Subclinical hypothyroidism   6. Iron deficiency   7. Menopausal hot flushes   8. Viral URI with cough      Plan  Female Wellness Visit: Age appropriate Health Maintenance and Prevention measures were discussed with patient. Included topics are cancer screening recommendations, ways to keep healthy (see AVS) including dietary and exercise recommendations, regular eye and dental care, use of seat belts, and avoidance of moderate alcohol use and tobacco use.  To get female wellness exam mammogram and Pap smear done next week. BMI: discussed patient's BMI and encouraged positive lifestyle modifications to help get to or maintain a target BMI. HM needs and immunizations were addressed and ordered. See below for orders. See HM and immunization section for updates.  Up-to-date Routine labs and screening tests ordered including cmp, cbc and lipids where appropriate. Discussed recommendations regarding Vit D and calcium supplementation (see AVS)  Chronic disease management visit and/or acute problem visit: Menopausal symptoms: Counseling and education given.  Discussed alternative remedies.  See after visit summary.  She will also discuss with GYN. Recheck iron levels.  Should be improved.  Otherwise we will start supplements Recheck thyroid levels.  Clinically euthyroid Family history of colon cancer: Screens are current. Recurrent vaginitis: Would recommend or favor decreasing use as tolerated.  Can space out dosing and see if that continues to prevent further infections or can take breaks.  She also discussed with GYN. Viral URI, postinflammatory cough: Add cough syrup.  Reassured.  Follow up: 12 months for complete physical  Orders Placed This Encounter  Procedures   CBC with Differential/Platelet   Comprehensive metabolic panel   Iron, TIBC and Ferritin Panel   Lipid panel   T3   T4, free   TSH   Meds ordered this encounter  Medications   fluconazole (DIFLUCAN)  100 MG tablet    Sig: Take 1 tablet (100 mg total) by mouth once a week.    Additional refills at appointment   valACYclovir (VALTREX) 500 MG tablet    Sig: Take 4 tablets (2,000 mg total) by mouth in the morning and at bedtime. As needed for cold sore    Dispense:  20 tablet    Refill:  2   guaiFENesin-codeine 100-10 MG/5ML syrup    Sig: Take 5 mLs by mouth at bedtime as needed for cough.    Dispense:  120 mL    Refill:  0   benzonatate (TESSALON) 200 MG capsule    Sig: Take 1 capsule (200 mg total) by mouth 2 (two) times daily as needed for cough.    Dispense:  20 capsule    Refill:  0      Body mass index is 26.16 kg/m. Wt Readings from Last 3 Encounters:  04/25/21 152 lb 6.4 oz (69.1 kg)  04/22/21 153 lb (69.4 kg)  08/02/20 143 lb (64.9 kg)     Patient Active Problem List   Diagnosis Date Noted   Family history of colon cancer in mother Apr 26, 2020    Priority: High    Deceased age 68 q 53 year colonoscopy: Dr Tarri Glenn, 07/2020: tics and melanosis. 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    Priority: Low   Iron deficiency 04/25/2021   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/30/2020    04/2020    Health Maintenance  Topic Date Due   COVID-19 Vaccine (3 - Booster for Pfizer series) 08/26/2019   PAP SMEAR-Modifier  05/01/2021 (Originally 04/02/2021)   MAMMOGRAM  12/30/2021   COLONOSCOPY (Pts 45-31yrs Insurance coverage will need to be confirmed)  08/02/2025   TETANUS/TDAP  26-Apr-2030   INFLUENZA VACCINE  Completed   Hepatitis C Screening  Completed   Zoster Vaccines- Shingrix  Completed   Pneumococcal Vaccine 21-73 Years old  Aged Out   HPV VACCINES  Aged Out   HIV  Screening  Discontinued   Immunization History  Administered Date(s) Administered   Influenza,inj,Quad PF,6+ Mos 01/13/2013, 02/02/2017, 01/06/2018, 12/28/2018, 02/05/2020   Influenza,inj,quad, With Preservative 02/02/2014, 02/19/2015, 12/05/2015   Influenza-Unspecified 12/25/2020   PFIZER(Purple Top)SARS-COV-2 Vaccination 06/10/2019, 07/01/2019   Tdap 03/31/2010, 04-26-2020   Zoster Recombinat (Shingrix) 12/25/2020, 02/26/2021   We updated and reviewed the patient's past history in detail and it is documented below. Allergies: Patient has No Known Allergies. Past Medical History Patient  has a past medical history of History of vaginal infection and HSV-1 infection. Past Surgical History Patient  has a past surgical history that includes Wisdom tooth extraction. Family History: Patient family history includes Breast cancer in her maternal aunt and paternal aunt; Colon cancer (age of onset: 40) in her mother; Healthy in her daughter, father, sister, and son; Hyperlipidemia in her brother;  Stroke in an other family member. Social History:  Patient  reports that she has never smoked. She has never used smokeless tobacco. She reports current alcohol use of about 1.0 standard drink per week. She reports that she does not use drugs.  Review of Systems: Constitutional: negative for fever or malaise Ophthalmic: negative for photophobia, double vision or loss of vision Cardiovascular: negative for chest pain, dyspnea on exertion, or new LE swelling Respiratory: negative for SOB or persistent cough Gastrointestinal: negative for abdominal pain, change in bowel habits or melena Genitourinary: negative for dysuria or gross hematuria, no abnormal uterine bleeding or disharge Musculoskeletal: negative for new gait disturbance or muscular weakness Integumentary: negative for new or persistent rashes, no breast lumps Neurological: negative for TIA or stroke symptoms Psychiatric: negative for SI or  delusions Allergic/Immunologic: negative for hives  Patient Care Team    Relationship Specialty Notifications Start End  Leamon Arnt, MD PCP - General Family Medicine  04/24/20   Michel Bickers, MD Consulting Physician Infectious Diseases  04/24/20   Nunzio Cobbs, MD Consulting Physician Obstetrics and Gynecology  04/24/20     Objective  Vitals: BP 120/90    Pulse (!) 50    Temp 98.2 F (36.8 C) (Temporal)    Ht 5\' 4"  (1.626 m)    Wt 152 lb 6.4 oz (69.1 kg)    LMP 01/08/2020 (Approximate)    SpO2 99%    BMI 26.16 kg/m  General:  Well developed, well nourished, no acute distress  Psych:  Alert and orientedx3,normal mood and affect HEENT:  Normocephalic, atraumatic, non-icteric sclera,  supple neck without adenopathy, mass or thyromegaly Cardiovascular:  Normal S1, S2, RRR without gallop, rub or murmur Respiratory:  Good breath sounds bilaterally, CTAB with normal respiratory effort Gastrointestinal: normal bowel sounds, soft, non-tender, no noted masses. No HSM MSK: no deformities, contusions. Joints are without erythema or swelling.  Skin:  Warm, no rashes or suspicious lesions noted Neurologic:    Mental status is normal. CN 2-11 are normal. Gross motor and sensory exams are normal. Normal gait. No tremor   Commons side effects, risks, benefits, and alternatives for medications and treatment plan prescribed today were discussed, and the patient expressed understanding of the given instructions. Patient is instructed to call or message via MyChart if he/she has any questions or concerns regarding our treatment plan. No barriers to understanding were identified. We discussed Red Flag symptoms and signs in detail. Patient expressed understanding regarding what to do in case of urgent or emergency type symptoms.  Medication list was reconciled, printed and provided to the patient in AVS. Patient instructions and summary information was reviewed with the patient as documented in  the AVS. This note was prepared with assistance of Dragon voice recognition software. Occasional wrong-word or sound-a-like substitutions may have occurred due to the inherent limitations of voice recognition software  This visit occurred during the SARS-CoV-2 public health emergency.  Safety protocols were in place, including screening questions prior to the visit, additional usage of staff PPE, and extensive cleaning of exam room while observing appropriate contact time as indicated for disinfecting solutions.

## 2021-04-26 LAB — IRON,TIBC AND FERRITIN PANEL
%SAT: 25 % (calc) (ref 16–45)
Ferritin: 22 ng/mL (ref 16–232)
Iron: 92 ug/dL (ref 45–160)
TIBC: 375 mcg/dL (calc) (ref 250–450)

## 2021-04-26 LAB — T3: T3, Total: 97 ng/dL (ref 76–181)

## 2021-04-29 ENCOUNTER — Ambulatory Visit (INDEPENDENT_AMBULATORY_CARE_PROVIDER_SITE_OTHER): Payer: BC Managed Care – PPO | Admitting: Obstetrics and Gynecology

## 2021-04-29 ENCOUNTER — Encounter: Payer: Self-pay | Admitting: Obstetrics and Gynecology

## 2021-04-29 ENCOUNTER — Other Ambulatory Visit: Payer: Self-pay

## 2021-04-29 ENCOUNTER — Other Ambulatory Visit (HOSPITAL_COMMUNITY)
Admission: RE | Admit: 2021-04-29 | Discharge: 2021-04-29 | Disposition: A | Discharge status: Home / Self Care | Payer: BC Managed Care – PPO | Source: Ambulatory Visit | Attending: Obstetrics and Gynecology | Admitting: Obstetrics and Gynecology

## 2021-04-29 VITALS — BP 134/62 | HR 66 | Ht 63.5 in | Wt 150.0 lb

## 2021-04-29 DIAGNOSIS — Z124 Encounter for screening for malignant neoplasm of cervix: Secondary | ICD-10-CM

## 2021-04-29 DIAGNOSIS — Z01419 Encounter for gynecological examination (general) (routine) without abnormal findings: Secondary | ICD-10-CM

## 2021-04-29 NOTE — Patient Instructions (Signed)

## 2021-04-29 NOTE — Progress Notes (Signed)
51 y.o. G58P2002 Married [place] female here for annual exam.    No further periods.  Has night sweats and some vaginal dryness. Taking OTC herbal treatment, which helps the symptoms.  Able to sleep.   Continuing Diflucan weekly for recurrent yeast infections.  Sees infectious disease specialist.   PCP refilled Valtrex.   Has a cough that has been persistent.  Saw provider.  Has some left breast/chest discomfort with cough and deep breath.   PCP: Billey Chang, MD  Patient's last menstrual period was 01/08/2020 (approximate).           Sexually active: Yes.    The current method of family planning is vasectomy.    Exercising: Yes.     Tennis 2-3x/week Smoker:  no  Health Maintenance: Pap:   04-02-16 Neg:Neg HR HPV History of abnormal Pap:  no MMG:  12-30-20 Neg/Birads1 Colonoscopy:  08-02-20;next 5 yrs d/t family history BMD:   n/a  Result  n/a TDaP:  PCP Gardasil:   no HIV:no Hep C:04-24-20 Neg Screening Labs:  PCP.  Flu vaccine:  completed.  CoviD:  bivalent:  completed.  Shingrix:  Completed.    reports that she has never smoked. She has never used smokeless tobacco. She reports current alcohol use of about 1.0 standard drink per week. She reports that she does not use drugs.  Past Medical History:  Diagnosis Date   History of vaginal infection    Recurrents    HSV-1 infection     Past Surgical History:  Procedure Laterality Date   WISDOM TOOTH EXTRACTION      Current Outpatient Medications  Medication Sig Dispense Refill   fluconazole (DIFLUCAN) 100 MG tablet Take 1 tablet (100 mg total) by mouth once a week.     Multiple Vitamins-Minerals (MULTIVITAMIN WOMEN PO) Take 1 tablet by mouth daily.     OVER THE COUNTER MEDICATION Amberen [id number] daily     valACYclovir (VALTREX) 500 MG tablet Take 4 tablets (2,000 mg total) by mouth in the morning and at bedtime. As needed for cold sore 20 tablet 2   No current facility-administered medications for this  visit.    Family History  Problem Relation Age of Onset   Colon cancer Mother 85   Healthy Father    Breast cancer Maternal Aunt    Breast cancer Paternal Aunt    Healthy Sister    Hyperlipidemia Brother    Healthy Daughter    Healthy Son    Stroke Other    Depression Neg Hx    Diabetes Neg Hx    Dementia Neg Hx    Colon polyps Neg Hx    Rectal cancer Neg Hx    Stomach cancer Neg Hx     Review of Systems  All other systems reviewed and are negative.  Exam:   BP 134/62    Pulse 66    Ht 5' 3.5" (1.613 m)    Wt 150 lb (68 kg)    LMP 01/08/2020 (Approximate)    SpO2 (!) 89%    BMI 26.15 kg/m     General appearance: alert, cooperative and appears stated age Head: normocephalic, without obvious abnormality, atraumatic Neck: no adenopathy, supple, symmetrical, trachea midline and thyroid normal to inspection and palpation Lungs: clear to auscultation bilaterally Breasts: normal appearance, no masses or tenderness, No nipple retraction or dimpling, No nipple discharge or bleeding, No axillary adenopathy Heart: regular rate and rhythm Abdomen: soft, non-tender; no masses, no organomegaly Extremities: extremities normal, atraumatic, no  cyanosis or edema Skin: skin color, texture, turgor normal. No rashes or lesions Lymph nodes: cervical, supraclavicular, and axillary nodes normal. Neurologic: grossly normal  Pelvic: External genitalia:  no lesions              No abnormal inguinal nodes palpated.              Urethra:  normal appearing urethra with no masses, tenderness or lesions              Bartholins and Skenes: normal                 Vagina: normal appearing vagina with normal color and discharge, no lesions              Cervix: no lesions              Pap taken: yes Bimanual Exam:  Uterus:  normal size, contour, position, consistency, mobility, non-tender              Adnexa: no mass, fullness, tenderness              Rectal exam: yes.  Confirms.              Anus:   normal sphincter tone, no lesions  Chaperone was present for exam: Estill Bamberg, CMA  Assessment:   Well woman visit with gynecologic exam. Menopausal female.  Hx chronic vulvovaginitis.  On Fluconazole 100 mg weekly as prophylaxis.  Hx oral HSV.  Uses Valtrex prn.  FH colon and breast cancer.   Plan: Mammogram screening discussed. Self breast awareness reviewed. Pap and HR HPV as above. Guidelines for Calcium, Vitamin D, regular exercise program including cardiovascular and weight bearing exercise. She will contact her ID specialist to determine if she may stop the weekly Diflucan now that she is postmenopausal. Follow up annually and prn.   After visit summary provided.

## 2021-04-30 LAB — CYTOLOGY - PAP
Comment: NEGATIVE
Diagnosis: NEGATIVE
High risk HPV: NEGATIVE

## 2021-05-07 ENCOUNTER — Telehealth: Payer: Self-pay

## 2021-05-07 NOTE — Telephone Encounter (Signed)
Called patient to get an appointment scheduled, per request.. Left patient a voicemail to call us back to get scheduled.

## 2021-05-20 ENCOUNTER — Ambulatory Visit: Payer: BC Managed Care – PPO | Admitting: Internal Medicine

## 2021-09-16 IMAGING — MG DIGITAL SCREENING BILAT W/ TOMO W/ CAD
6 of 10 series · 6 of 30 positions shown · non-contrast
Comparison: Previous exam(s).

CLINICAL DATA: Screening.

EXAM:
DIGITAL SCREENING BILATERAL MAMMOGRAM WITH TOMO AND CAD

[R MLO synth-2D (1 of 2)]
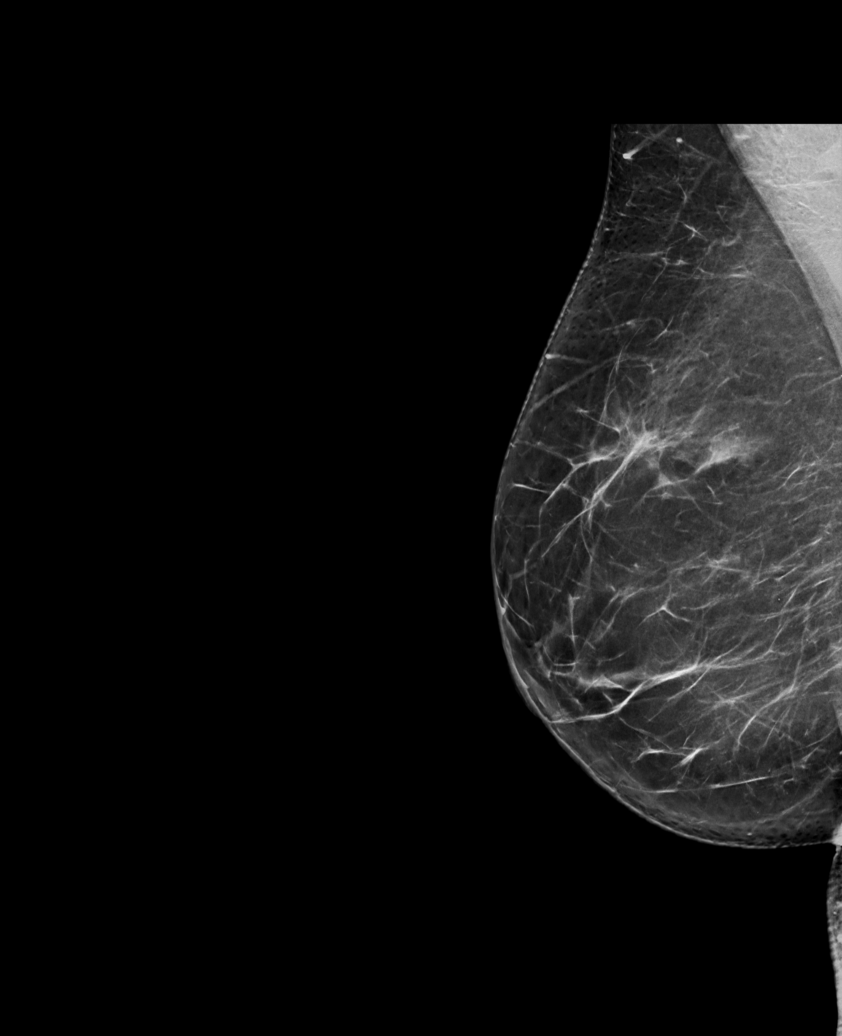

[L MLO synth-2D]
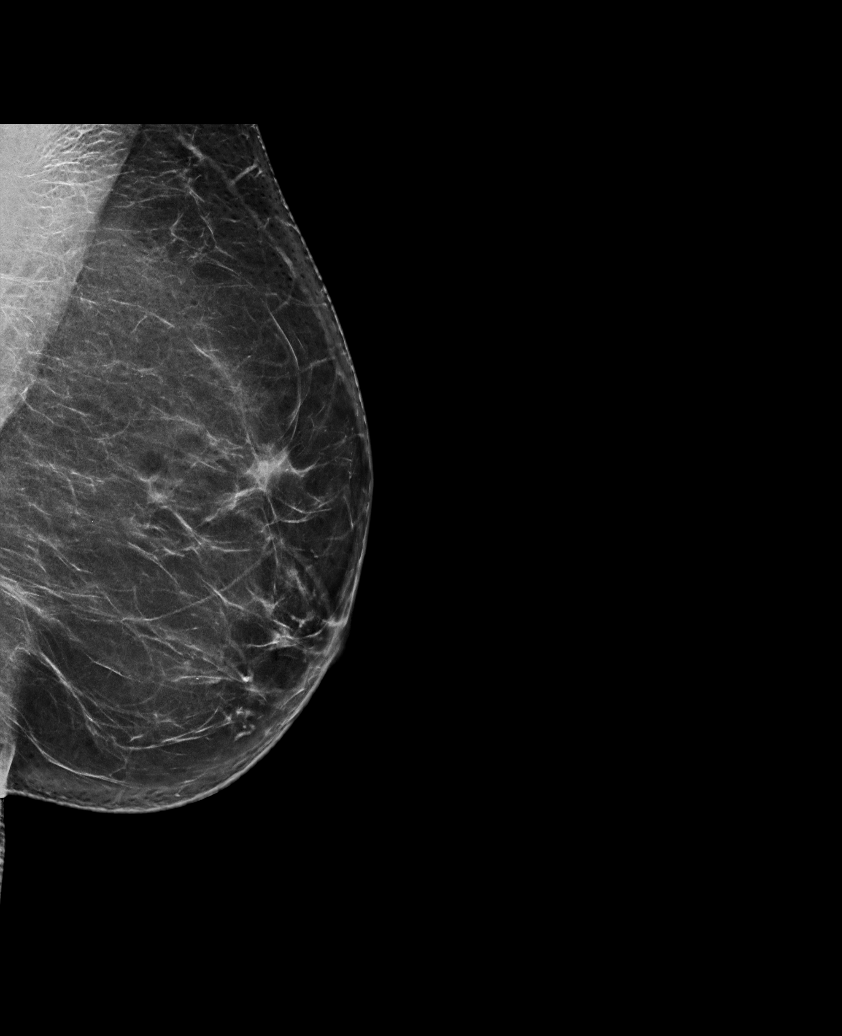

[L CC synth-2D]
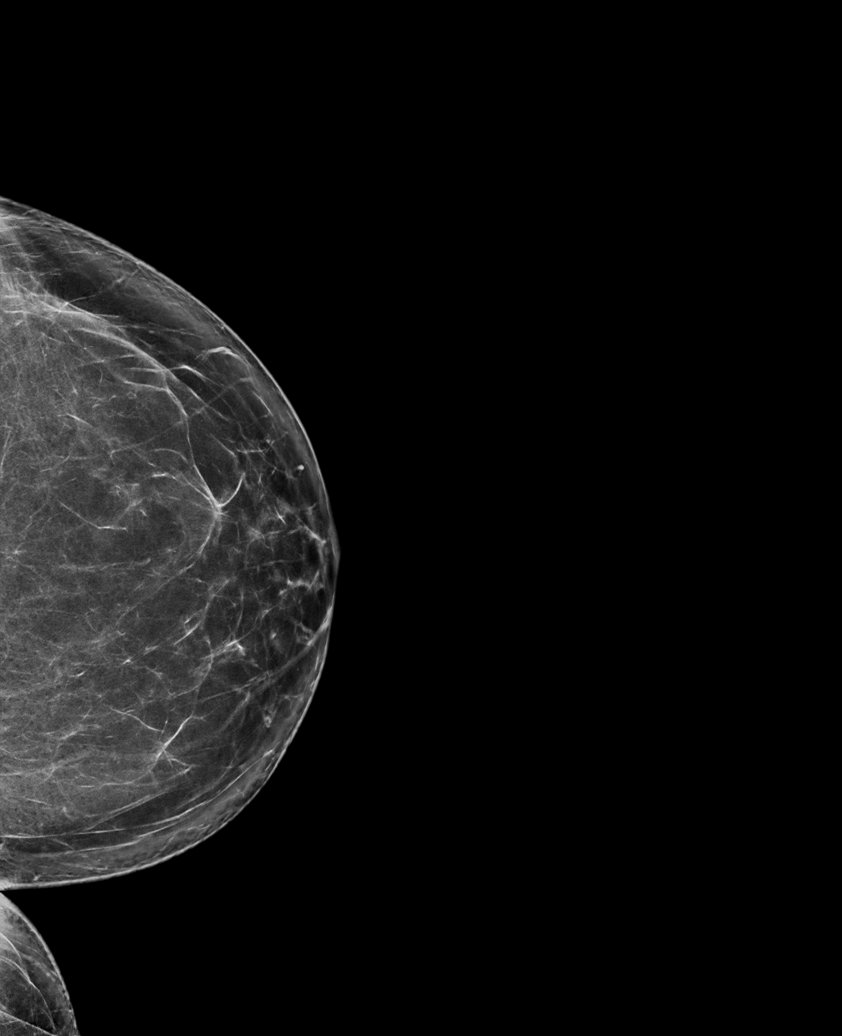

[R CC synth-2D]
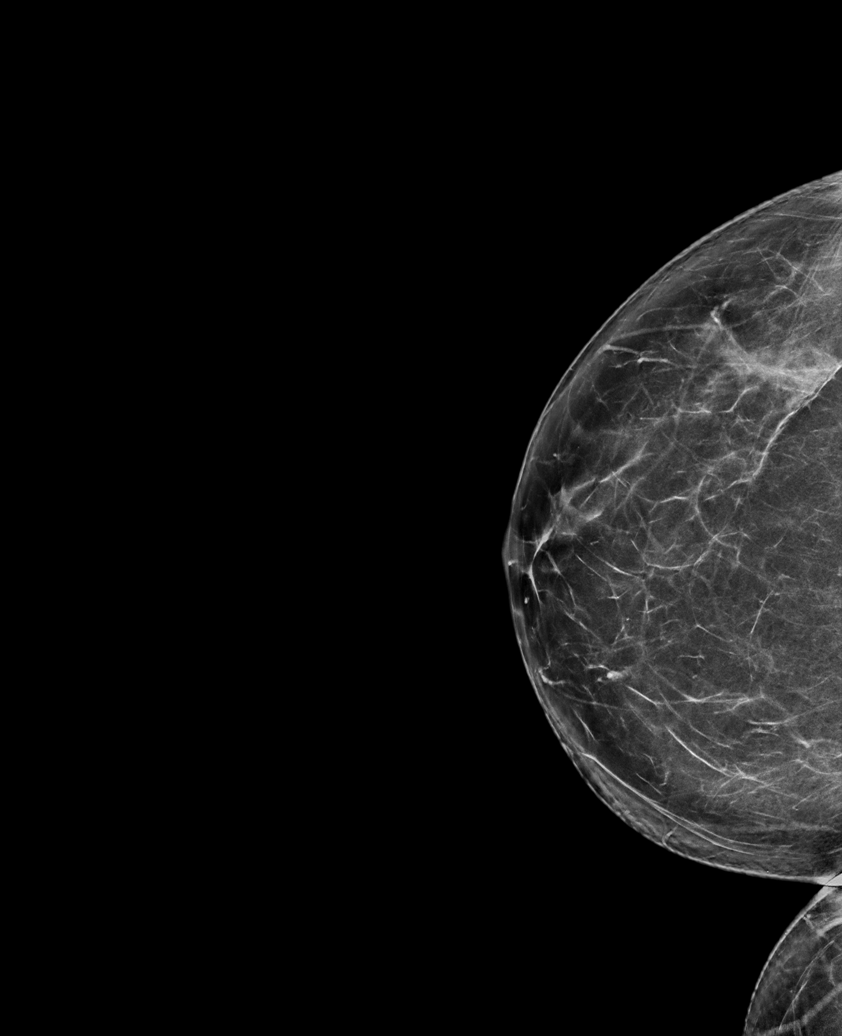

[R MLO synth-2D (2 of 2)]
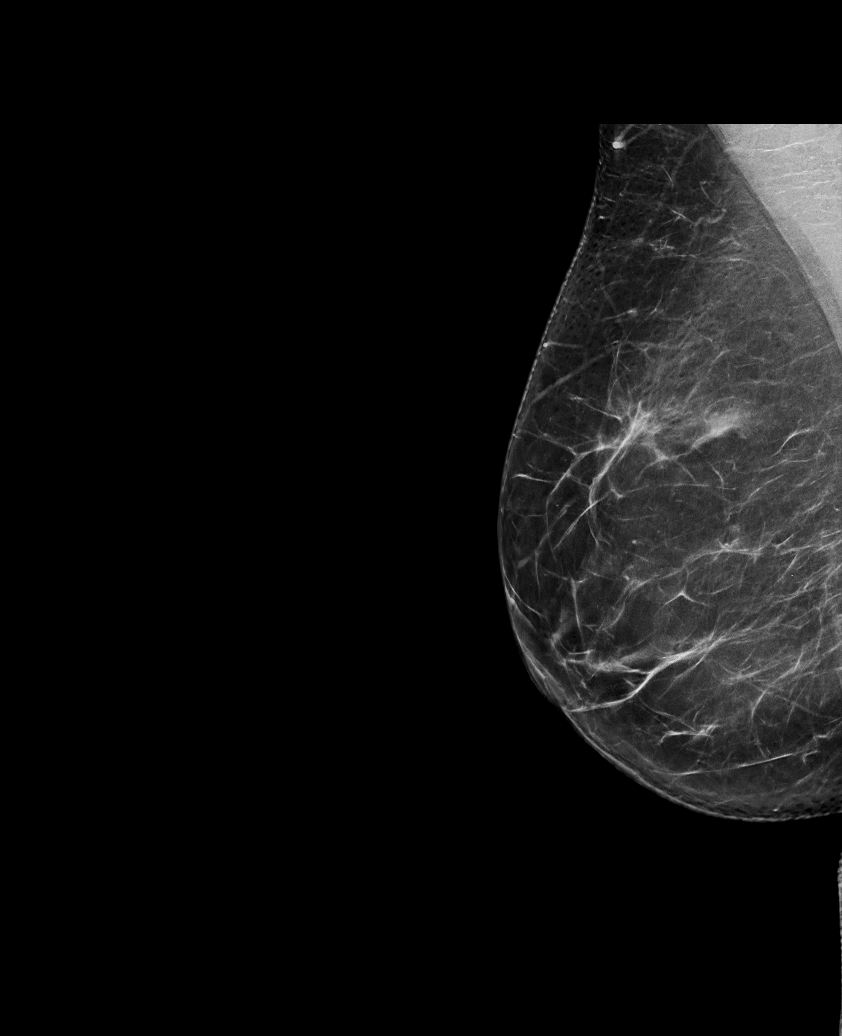

[R MLO tomo · tomo slice 44/87.0]
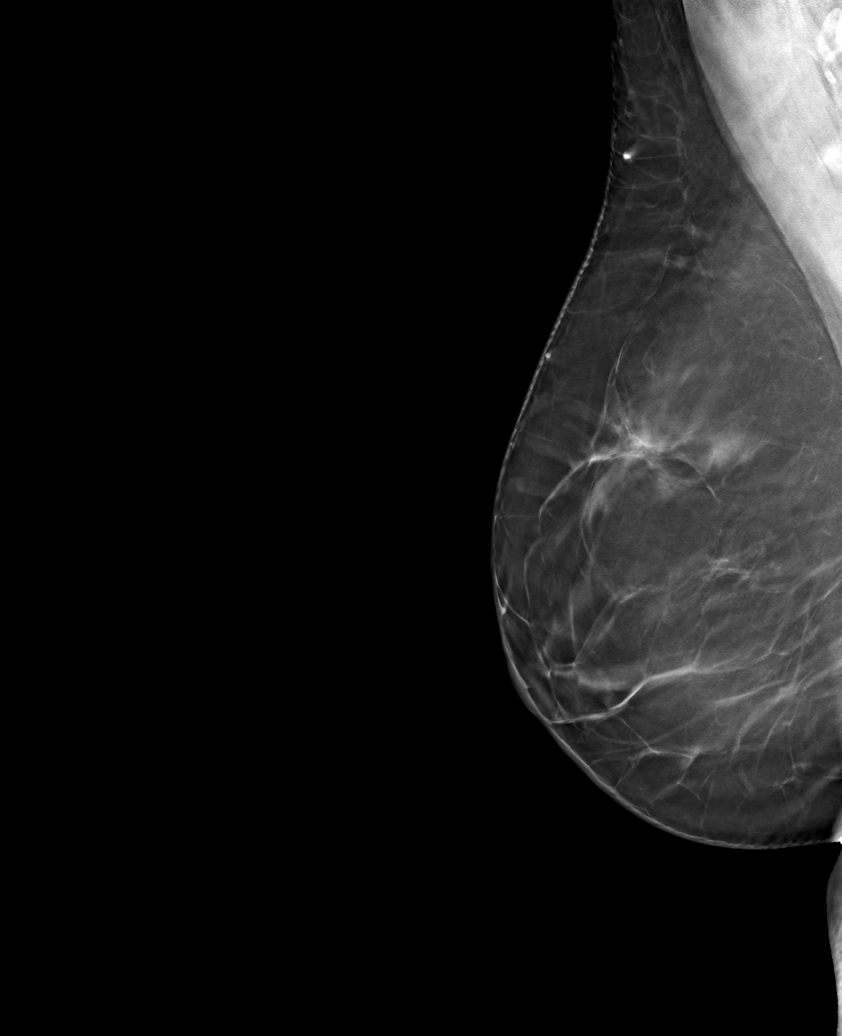

[6 of 30 positions shown; findings below may reference images not displayed]

ACR Breast Density Category b: There are scattered areas of
fibroglandular density.
FINDINGS: There are no findings suspicious for malignancy. Images were
processed with CAD.
IMPRESSION: No mammographic evidence of malignancy. A result letter of this
screening mammogram will be mailed directly to the patient.

RECOMMENDATION:
Screening mammogram in one year. (Code:[id number])

BI-RADS CATEGORY  1: Negative.

## 2022-01-01 ENCOUNTER — Other Ambulatory Visit (HOSPITAL_BASED_OUTPATIENT_CLINIC_OR_DEPARTMENT_OTHER): Payer: Self-pay | Admitting: Family Medicine

## 2022-01-01 DIAGNOSIS — Z1231 Encounter for screening mammogram for malignant neoplasm of breast: Secondary | ICD-10-CM

## 2022-01-02 ENCOUNTER — Ambulatory Visit (HOSPITAL_BASED_OUTPATIENT_CLINIC_OR_DEPARTMENT_OTHER)
Admission: RE | Admit: 2022-01-02 | Discharge: 2022-01-02 | Disposition: A | Discharge status: Home / Self Care | Payer: BC Managed Care – PPO | Source: Ambulatory Visit | Attending: Family Medicine | Admitting: Family Medicine

## 2022-01-02 ENCOUNTER — Encounter (HOSPITAL_BASED_OUTPATIENT_CLINIC_OR_DEPARTMENT_OTHER): Payer: Self-pay | Admitting: Radiology

## 2022-01-02 DIAGNOSIS — Z1231 Encounter for screening mammogram for malignant neoplasm of breast: Secondary | ICD-10-CM | POA: Diagnosis present

## 2022-01-05 ENCOUNTER — Encounter: Payer: Self-pay | Admitting: *Deleted

## 2022-03-26 ENCOUNTER — Encounter: Payer: Self-pay | Admitting: *Deleted

## 2022-04-03 ENCOUNTER — Encounter: Payer: Self-pay | Admitting: Family Medicine

## 2022-04-03 ENCOUNTER — Ambulatory Visit (INDEPENDENT_AMBULATORY_CARE_PROVIDER_SITE_OTHER): Payer: BC Managed Care – PPO | Admitting: Family Medicine

## 2022-04-03 VITALS — BP 110/70 | HR 51 | Temp 98.3°F | Ht 63.5 in | Wt 148.6 lb

## 2022-04-03 DIAGNOSIS — H00021 Hordeolum internum right upper eyelid: Secondary | ICD-10-CM

## 2022-04-03 MED ORDER — ERYTHROMYCIN 5 MG/GM OP OINT: 1.0000 | TOPICAL_OINTMENT | Freq: Three times a day (TID) | OPHTHALMIC | 0 refills | Status: DC

## 2022-04-03 NOTE — Progress Notes (Signed)
Subjective  CC:  Chief Complaint  Patient presents with   eye sore    Pt states eye cyst came about on Tuesday, grew a little. Pt states no longer growing, does not hurt .     HPI: Christine Crawford is a 51 y.o. female who presents to the office today to address the problems listed above in the chief complaint. Upper left eyelid with raised nodule x a few days. Not draining or painful. No vision changes. No eye pain. Otherwise feels well.   Assessment  1. Hordeolum internum of right upper eyelid      Plan  stye:  education, warm compresses, abx ointment tid and time.   Follow up: as scheduled.  04/29/2022  No orders of the defined types were placed in this encounter.  Meds ordered this encounter  Medications   erythromycin ophthalmic ointment    Sig: Place 1 Application into the left eye 3 (three) times daily.    Dispense:  3.5 g    Refill:  0      I reviewed the patients updated PMH, FH, and SocHx.    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   Multiple Vitamins-Minerals (MULTIVITAMIN WOMEN PO) Take 1 tablet by mouth daily.   valACYclovir (VALTREX) 500 MG tablet Take 4 tablets (2,000 mg total) by mouth in the morning and at bedtime. As needed for cold sore    Allergies: Patient has No Known Allergies. Family History: Patient family history includes Breast cancer in her  maternal aunt and paternal aunt; Colon cancer (age of onset: 67) in her mother; Healthy in her daughter, father, sister, and son; Hyperlipidemia in her brother; Stroke in an other family member. Social History:  Patient  reports that she has never smoked. She has never used smokeless tobacco. She reports current alcohol use of about 1.0 standard drink of alcohol per week. She reports that she does not use drugs.  Review of Systems: Constitutional: Negative for fever malaise or anorexia Cardiovascular: negative for chest pain Respiratory: negative for SOB or persistent cough Gastrointestinal: negative for abdominal pain  Objective  Vitals: BP 110/70 (BP Location: Right Arm, Patient Position: Sitting)   Pulse (!) 51   Temp 98.3 F (36.8 C) (Temporal)   Ht 5' 3.5" (1.613 m)   Wt 148 lb 9.6 oz (67.4 kg)   LMP 01/08/2020 (Approximate)   SpO2 100%   BMI 25.91 kg/m  General: no acute distress , A&Ox3 HEENT: PEERL, conjunctiva normal bilaterally, upper left internal eyelid with approx 66mm nodule w/ erythema, no drainage, neck is supple   Commons side effects, risks, benefits, and alternatives for medications and treatment plan prescribed today were discussed, and the patient expressed understanding of the given instructions. Patient is instructed to call or message via MyChart if he/she has any questions or concerns  regarding our treatment plan. No barriers to understanding were identified. We discussed Red Flag symptoms and signs in detail. Patient expressed understanding regarding what to do in case of urgent or emergency type symptoms.  Medication list was reconciled, printed and provided to the patient in AVS. Patient instructions and summary information was reviewed with the patient as documented in the AVS. This note was prepared with assistance of Dragon voice recognition software. Occasional wrong-word or sound-a-like substitutions may have occurred due to the inherent limitations of voice  recognition software  This visit occurred during the SARS-CoV-2 public health emergency.  Safety protocols were in place, including screening questions prior to the visit, additional usage of staff PPE, and extensive cleaning of exam room while observing appropriate contact time as indicated for disinfecting solutions.

## 2022-04-03 NOTE — Patient Instructions (Signed)
Please follow up if symptoms do not improve or as needed.    Stye A stye, also known as a hordeolum, is a bump that forms on an eyelid. It may look like a pimple next to the eyelash. A stye can form inside the eyelid (internal stye) or outside the eyelid (external stye). A stye can cause redness, swelling, and pain on the eyelid. Styes are very common. Anyone can get them at any age. They usually occur in just one eye at a time, but you may have more than one in either eye. What are the causes? A stye is caused by an infection. The infection is almost always caused by bacteria called Staphylococcus aureus. This is a common type of bacteria that lives on the skin. An internal stye may result from an infected oil-producing gland inside the eyelid. An external stye may be caused by an infection at the base of the eyelash (hair follicle). What increases the risk? You are more likely to develop a stye if: You have had a stye before. You have any of these conditions: Red, itchy, inflamed eyelids (blepharitis). A skin condition such as seborrheic dermatitis or rosacea. High fat levels in your blood (lipids). Dry eyes. What are the signs or symptoms? The most common symptom of a stye is eyelid pain. Internal styes are more painful than external styes. Other symptoms may include: Painful swelling of your eyelid. A scratchy feeling in your eye. Tearing and redness of your eye. A pimple-like bump on the edge of the eyelid. Pus draining from the stye. How is this diagnosed? Your health care provider may be able to diagnose a stye just by examining your eye. The health care provider may also check to make sure: You do not have a fever or other signs of a more serious infection. The infection has not spread to other parts of your eye or areas around your eye. How is this treated? Most styes will clear up in a few days without treatment or with warm compresses applied to the area. You may need to use  antibiotic drops or ointment to treat an infection. Sometimes, steroid drops or ointment are used in addition to antibiotics. In some cases, your health care provider may give you a small steroid injection in the eyelid. If your stye does not heal with routine treatment, your health care provider may drain pus from the stye using a thin blade or needle. This may be done if the stye is large, causing a lot of pain, or affecting your vision. Follow these instructions at home: Take over-the-counter and prescription medicines only as told by your health care provider. This includes eye drops or ointments. If you were prescribed an antibiotic medicine, steroid medicine, or both, apply or use them as told by your health care provider. Do not stop using the medicine even if your condition improves. Apply a warm, wet cloth (warm compress) to your eye for 5-10 minutes, 4 to 6 times a day. Clean the affected eyelid as directed by your health care provider. Do not wear contact lenses or eye makeup until your stye has healed and your health care provider says that it is safe. Do not try to pop or drain the stye. Do not rub your eye. Contact a health care provider if: You have chills or a fever. Your stye does not go away after several days. Your stye affects your vision. Your eyeball becomes swollen, red, or painful. Get help right away if: You have  pain when moving your eye around. Summary A stye is a bump that forms on an eyelid. It may look like a pimple next to the eyelash. A stye can form inside the eyelid (internal stye) or outside the eyelid (external stye). A stye can cause redness, swelling, and pain on the eyelid. Your health care provider may be able to diagnose a stye just by examining your eye. Apply a warm, wet cloth (warm compress) to your eye for 5-10 minutes, 4 to 6 times a day. This information is not intended to replace advice given to you by your health care provider. Make sure you  discuss any questions you have with your health care provider. Document Revised: 06/05/2020 Document Reviewed: 06/05/2020 Elsevier Patient Education  2023 [employer].

## 2022-04-29 ENCOUNTER — Encounter: Payer: Self-pay | Admitting: Family Medicine

## 2022-04-29 ENCOUNTER — Ambulatory Visit (INDEPENDENT_AMBULATORY_CARE_PROVIDER_SITE_OTHER): Payer: BC Managed Care – PPO | Admitting: Family Medicine

## 2022-04-29 VITALS — BP 122/70 | HR 50 | Temp 97.9°F | Ht 63.5 in | Wt 151.2 lb

## 2022-04-29 DIAGNOSIS — N951 Menopausal and female climacteric states: Secondary | ICD-10-CM

## 2022-04-29 DIAGNOSIS — Z800 Family history of malignant neoplasm of digestive organs: Secondary | ICD-10-CM

## 2022-04-29 DIAGNOSIS — Z0000 Encounter for general adult medical examination without abnormal findings: Secondary | ICD-10-CM

## 2022-04-29 DIAGNOSIS — N760 Acute vaginitis: Secondary | ICD-10-CM | POA: Diagnosis not present

## 2022-04-29 DIAGNOSIS — E038 Other specified hypothyroidism: Secondary | ICD-10-CM

## 2022-04-29 LAB — CBC WITH DIFFERENTIAL/PLATELET
Basophils Absolute: 0 10*3/uL (ref 0.0–0.1)
Basophils Relative: 0.4 % (ref 0.0–3.0)
Eosinophils Absolute: 0.1 10*3/uL (ref 0.0–0.7)
Eosinophils Relative: 1.8 % (ref 0.0–5.0)
HCT: 40.2 % (ref 36.0–46.0)
Hemoglobin: 13.4 g/dL (ref 12.0–15.0)
Lymphocytes Relative: 22.9 % (ref 12.0–46.0)
Lymphs Abs: 1.8 10*3/uL (ref 0.7–4.0)
MCHC: 33.3 g/dL (ref 30.0–36.0)
MCV: 89.9 fl (ref 78.0–100.0)
Monocytes Absolute: 0.6 10*3/uL (ref 0.1–1.0)
Monocytes Relative: 7.2 % (ref 3.0–12.0)
Neutro Abs: 5.4 10*3/uL (ref 1.4–7.7)
Neutrophils Relative %: 67.7 % (ref 43.0–77.0)
Platelets: 258 10*3/uL (ref 150.0–400.0)
RBC: 4.48 Mil/uL (ref 3.87–5.11)
RDW: 14.2 % (ref 11.5–15.5)
WBC: 8 10*3/uL (ref 4.0–10.5)

## 2022-04-29 LAB — COMPREHENSIVE METABOLIC PANEL
ALT: 13 U/L (ref 0–35)
AST: 18 U/L (ref 0–37)
Albumin: 4.3 g/dL (ref 3.5–5.2)
Alkaline Phosphatase: 78 U/L (ref 39–117)
BUN: 17 mg/dL (ref 6–23)
CO2: 29 mEq/L (ref 19–32)
Calcium: 9.4 mg/dL (ref 8.4–10.5)
Chloride: 105 mEq/L (ref 96–112)
Creatinine, Ser: 0.69 mg/dL (ref 0.40–1.20)
GFR: 100.15 mL/min (ref >60.00)
Glucose, Bld: 93 mg/dL (ref 70–99)
Potassium: 4.2 mEq/L (ref 3.5–5.1)
Sodium: 140 mEq/L (ref 135–145)
Total Bilirubin: 0.4 mg/dL (ref 0.2–1.2)
Total Protein: 6.8 g/dL (ref 6.0–8.3)

## 2022-04-29 LAB — LIPID PANEL
Cholesterol: 250 mg/dL — ABNORMAL HIGH (ref 0–200)
HDL: 84.7 mg/dL (ref >39.00)
LDL Cholesterol: 146 mg/dL — ABNORMAL HIGH (ref 0–99)
NonHDL: 165.41
Total CHOL/HDL Ratio: 3
Triglycerides: 97 mg/dL (ref 0.0–149.0)
VLDL: 19.4 mg/dL (ref 0.0–40.0)

## 2022-04-29 LAB — TSH: TSH: 3.82 u[IU]/mL (ref 0.35–5.50)

## 2022-04-29 LAB — HEMOGLOBIN A1C: Hgb A1c MFr Bld: 5.6 % (ref 4.6–6.5)

## 2022-04-29 NOTE — Progress Notes (Signed)
Subjective  Chief Complaint  Patient presents with   Annual Exam    Pt here for Annual Exam and is not currently fasting     HPI: Christine Crawford is a 52 y.o. female who presents to [facility] today for a Female Wellness Visit. She also has the concerns and/or needs as listed above in the chief complaint. These will be addressed in addition to the Health Maintenance Visit.   Wellness Visit: annual visit with health maintenance review and exam without Pap  HM: screens are current. To see GYN soon. Exercises and eats mostly healthy. Eats chocolate. Cooks 6/7 days per week. Tennis. Imms are current.  Chronic disease f/u and/or acute problem visit: (deemed necessary to be done in addition to the wellness visit): Recurrent vaginitis formerly treated with diflucan per ID: now resolved since postmenopausal. Hot flushes remain; improving. Still with black cohosh use. No vaginal bleeding.  No thyroid sxs.   Assessment  1. Annual physical exam   2. Family history of colon cancer in mother   61. Recurrent vaginitis   4. Menopausal hot flushes   5. Subclinical hypothyroidism      Plan  Female Wellness Visit: Age appropriate Health Maintenance and Prevention measures were discussed with patient. Included topics are cancer screening recommendations, ways to keep healthy (see AVS) including dietary and exercise recommendations, regular eye and dental care, use of seat belts, and avoidance of moderate alcohol use and tobacco use.  BMI: discussed patient's BMI and encouraged positive lifestyle modifications to help get to or maintain a target BMI. HM needs and immunizations were addressed and ordered. See below for orders. See HM and immunization section for updates. Routine labs and screening tests ordered including cmp, cbc and lipids where appropriate. Discussed recommendations regarding Vit D and calcium supplementation (see AVS)  Chronic disease management  visit and/or acute problem visit: Vasomotor sxs: to discuss use of black cohosh; typically use as short term medication due to estrogen like effects on endometrium. Education given. Monitor diet. Recheck lipids and thyroid.   Follow up: Return in about 1 year (around 04/30/2023) for complete physical.  Orders Placed This Encounter  Procedures   CBC with Differential/Platelet   Comprehensive metabolic panel   Lipid panel   Hemoglobin A1c   TSH   No orders of the defined types were placed in this encounter.     Body mass index is 26.36 kg/m. Wt Readings from Last 3 Encounters:  04/29/22 151 lb 3.2 oz (68.6 kg)  04/03/22 148 lb 9.6 oz (67.4 kg)  04/29/21 150 lb (68 kg)     Patient Active Problem List   Diagnosis Date Noted   Family history of colon cancer in mother 2020-05-21    Priority: High    Deceased age 25 q 41 year colonoscopy: Dr Tarri Glenn, 07/2020: tics and melanosis. 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    Priority: Low   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/30/2020    04/2020    Health Maintenance  Topic Date Due   COVID-19 Vaccine (3 - 2023-24 season) 05/15/2022 (Originally 12/12/2021)   MAMMOGRAM  01/03/2023   COLONOSCOPY (Pts 45-54yrs Insurance coverage will need to be confirmed)  08/02/2025   PAP SMEAR-Modifier  04/29/2026   DTaP/Tdap/Td (3 - Td or Tdap) 04/24/2030   INFLUENZA VACCINE  Completed   Hepatitis C Screening  Completed   Zoster Vaccines- Shingrix  Completed   HPV VACCINES  Aged Out   HIV Screening  Discontinued   Immunization History  Administered Date(s) Administered   Influenza,inj,Quad PF,6+ Mos 01/13/2013, 02/02/2017, 01/06/2018,  12/28/2018, 02/05/2020, 02/09/2022   Influenza,inj,quad, With Preservative 02/02/2014, 02/19/2015, 12/05/2015   Influenza-Unspecified 12/25/2020   PFIZER(Purple Top)SARS-COV-2 Vaccination 06/10/2019, 07/01/2019   Tdap 03/31/2010, 04/24/2020   Zoster Recombinat (Shingrix) 12/25/2020, 02/26/2021   We updated and reviewed the patient's past history in detail and it is documented below. Allergies: Patient has No Known Allergies. Past Medical History Patient  has a past medical history of History of vaginal infection and HSV-1 infection. Past Surgical History Patient  has a past surgical history that includes Wisdom tooth extraction. Family History: Patient family history includes Breast cancer in her maternal aunt and paternal aunt; Colon cancer (age of onset: 52) in her mother; Healthy in her daughter, father, sister, and son; Hyperlipidemia in her brother; Stroke in an other family member. Social History:  Patient  reports that she has never smoked. She has never used smokeless tobacco. She reports current alcohol use of about 1.0 standard drink of alcohol per week. She reports that she does not use drugs.  Review of Systems: Constitutional: negative for fever or malaise Ophthalmic: negative for photophobia, double vision or loss of vision Cardiovascular: negative for chest pain, dyspnea on exertion, or new LE swelling Respiratory: negative for SOB or persistent cough Gastrointestinal: negative for abdominal pain, change in bowel habits or melena Genitourinary: negative for dysuria or gross hematuria, no abnormal uterine bleeding or disharge Musculoskeletal: negative for new gait disturbance or muscular weakness Integumentary: negative for new or persistent rashes, no breast lumps Neurological: negative for TIA or stroke symptoms Psychiatric: negative for SI or delusions Allergic/Immunologic: negative for hives  Patient Care Team    Relationship Specialty Notifications Start End   Willow Ora, MD PCP - General Family Medicine  04/24/20   Cliffton Asters, MD Consulting Physician Infectious Diseases  04/24/20   Patton Salles, MD Consulting Physician Obstetrics and Gynecology  04/24/20     Objective  Vitals: BP 122/70   Pulse (!) 50   Temp 97.9 F (36.6 C)   Ht 5' 3.5" (1.613 m)   Wt 151 lb 3.2 oz (68.6 kg)   LMP 01/08/2020 (Approximate)   SpO2 99%   BMI 26.36 kg/m  General:  Well developed, well nourished, no acute distress  Psych:  Alert and orientedx3,normal mood and affect HEENT:  Normocephalic, atraumatic, non-icteric sclera,  supple neck without adenopathy, mass or thyromegaly Cardiovascular:  Normal S1, S2, RRR without gallop, rub or murmur Respiratory:  Good breath sounds bilaterally, CTAB with normal respiratory effort Gastrointestinal: normal bowel sounds, soft, non-tender, no noted masses. No HSM MSK: no deformities, contusions. Joints are without erythema or swelling.  Skin:  Warm, no rashes or suspicious lesions noted Neurologic:    Mental status is normal. CN 2-11 are normal. Gross motor and sensory exams are normal. Normal gait. No tremor  Commons side effects, risks, benefits, and alternatives for medications and treatment plan prescribed today were discussed, and the patient expressed understanding of the given instructions. Patient is instructed to call or message via MyChart if he/she has any  questions or concerns regarding our treatment plan. No barriers to understanding were identified. We discussed Red Flag symptoms and signs in detail. Patient expressed understanding regarding what to do in case of urgent or emergency type symptoms.  Medication list was reconciled, printed and provided to the patient in AVS. Patient instructions and summary information was reviewed with the patient as documented in the AVS. This note was prepared with assistance of Dragon voice recognition software. Occasional wrong-word or sound-a-like  substitutions may have occurred due to the inherent limitations of voice recognition software

## 2022-04-29 NOTE — Patient Instructions (Signed)
Please return in 12 months for your annual complete physical; please come fasting.   I will release your lab results to you on your MyChart account with further instructions. You may see the results before I do, but when I review them I will send you a message with my report or have my assistant call you if things need to be discussed. Please reply to my message with any questions. Thank you!   Discuss with your GYN the continued use of Black Cohosh for your hot flushes.   If you have any questions or concerns, please don't hesitate to send me a message via MyChart or call the office at [phone]. Thank you for visiting with [place] today! It's our pleasure caring for you.   Please do these things to maintain good health!  Exercise at least 30-45 minutes a day,  4-5 days a week.  Eat a low-fat diet with lots of fruits and vegetables, up to 7-9 servings per day. Drink plenty of water daily. Try to drink 8 8oz glasses per day. Seatbelts can save your life. Always wear your seatbelt. Place Smoke Detectors on every level of your home and check batteries every year. Schedule an appointment with an eye doctor for an eye exam every 1-2 years Safe sex - use condoms to protect yourself from STDs if you could be exposed to these types of infections. Use birth control if you do not want to become pregnant and are sexually active. Avoid heavy alcohol use. If you drink, keep it to less than 2 drinks/day and not every day. [place].  Choose someone you trust that could speak for you if you became unable to speak for yourself. Depression is common in our stressful world.If you're feeling down or losing interest in things you normally enjoy, please come in for a visit. If anyone is threatening or hurting you, please get help. Physical or Emotional Violence is never OK.

## 2022-06-02 NOTE — Progress Notes (Signed)
52 y.o. G28P2002 Married [place]  female here for annual exam.    Stopped Fluconazole and no longer having yeast infections.   No more periods.  Took Black Cohosh for a time and it helped.   Has elevated cholesterol.  Is not taking medication.   Needs refill of Valtrex which she takes prn oral HSV.  PCP:   Dr. Jonni Sanger  Patient's last menstrual period was 01/08/2020 (approximate).           Sexually active: Yes.    The current method of family planning is post menopausal status/vasectomy.    Exercising: Yes.     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 BMD:   n/a  Result  n/a TDaP:  04/24/20 Gardasil:   no HIV: no Hep C: 04/24/20 Neg Screening Labs:  PCP   reports that she has never smoked. She has never used smokeless tobacco. She reports current alcohol use of about 1.0 standard drink of alcohol per week. She reports that she does not use drugs.  Past Medical History:  Diagnosis Date   History of vaginal infection    Recurrents    HSV-1 infection     Past Surgical History:  Procedure Laterality Date   WISDOM TOOTH EXTRACTION      Current Outpatient Medications  Medication Sig Dispense Refill   valACYclovir (VALTREX) 500 MG tablet Take 4 tablets (2,000 mg total) by mouth in the morning and at bedtime. As needed for cold sore 20 tablet 2   Multiple Vitamins-Minerals (MULTIVITAMIN WOMEN PO) Take 1 tablet by mouth daily. (Patient not taking: Reported on 06/16/2022)     No current facility-administered medications for this visit.    Family History  Problem Relation Age of Onset   Colon cancer Mother 38   Healthy Father    Breast cancer Maternal Aunt    Breast cancer Paternal Aunt    Healthy Sister    Hyperlipidemia Brother    Healthy Daughter    Healthy Son    Stroke  Other    Depression Neg Hx    Diabetes Neg Hx    Dementia Neg Hx    Colon polyps Neg Hx    Rectal cancer Neg Hx    Stomach cancer Neg Hx     Review of Systems  All other systems reviewed and are negative.   Exam:   BP 116/72 (BP Location: Right Arm, Patient Position: Sitting, Cuff Size: Normal)   Pulse 65   Ht '5\' 4"'$  (1.626 m)   Wt 148 lb (67.1 kg)   LMP 01/08/2020 (Approximate)   SpO2 98%   BMI 25.40 kg/m     General appearance: alert, cooperative and appears stated age Head: normocephalic, without obvious abnormality, atraumatic Neck: no adenopathy, supple, symmetrical, trachea midline and thyroid normal to inspection and palpation Lungs: clear to auscultation bilaterally Breasts: normal appearance, no masses or tenderness, No nipple retraction or dimpling, No nipple discharge or bleeding, No axillary adenopathy Heart: regular rate and rhythm Abdomen: soft, non-tender; no masses, no organomegaly Extremities: extremities normal, atraumatic, no cyanosis or edema Skin: skin color, texture, turgor normal. No rashes or lesions Lymph nodes: cervical, supraclavicular, and axillary nodes normal. Neurologic: grossly normal  Pelvic: External genitalia:  no lesions  No abnormal inguinal nodes palpated.              Urethra:  normal appearing urethra with no masses, tenderness or lesions              Bartholins and Skenes: normal                 Vagina: normal appearing vagina with normal color and discharge, no lesions              Cervix: no lesions              Pap taken: no Bimanual Exam:  Uterus:  normal size, contour, position, consistency, mobility, non-tender              Adnexa: no mass, fullness, tenderness              Rectal exam: yes.  Confirms.              Anus:  normal sphincter tone, no lesions  Chaperone was present for exam:  Raquel Sarna.  Assessment:   Well woman visit with gynecologic exam. Menopausal female.  Hx chronic vulvovaginitis.   Resolved. Hx oral HSV.  Uses Valtrex prn.  FH colon and breast cancer.  Elevated cholesterol.  Plan: Mammogram screening discussed. Self breast awareness reviewed. Pap and HR HPV as above. Guidelines for Calcium, Vitamin D, regular exercise program including cardiovascular and weight bearing exercise. Will check FSH and estradiol. Rx for Valtrex 2000 mg po bid x 24 hours prn oral HSV outbreak.  #20, RF 2.  Follow up annually and prn.   After visit summary provided.

## 2022-06-09 ENCOUNTER — Ambulatory Visit: Payer: BC Managed Care – PPO | Admitting: Obstetrics and Gynecology

## 2022-06-16 ENCOUNTER — Encounter: Payer: Self-pay | Admitting: Obstetrics and Gynecology

## 2022-06-16 ENCOUNTER — Ambulatory Visit (INDEPENDENT_AMBULATORY_CARE_PROVIDER_SITE_OTHER): Payer: BC Managed Care – PPO | Admitting: Obstetrics and Gynecology

## 2022-06-16 VITALS — BP 116/72 | HR 65 | Ht 64.0 in | Wt 148.0 lb

## 2022-06-16 DIAGNOSIS — N951 Menopausal and female climacteric states: Secondary | ICD-10-CM

## 2022-06-16 DIAGNOSIS — Z01419 Encounter for gynecological examination (general) (routine) without abnormal findings: Secondary | ICD-10-CM | POA: Diagnosis not present

## 2022-06-16 MED ORDER — VALACYCLOVIR HCL 500 MG PO TABS: 2000.0000 mg | ORAL_TABLET | Freq: Two times a day (BID) | ORAL | 2 refills | Status: AC

## 2022-06-16 NOTE — Patient Instructions (Signed)
EXERCISE AND DIET:  We recommended that you start or continue a regular exercise program for good health. Regular exercise means any activity that makes your heart beat faster and makes you sweat.  We recommend exercising at least 30 minutes per day at least 3 days a week, preferably 4 or 5.  We also recommend a diet low in fat and sugar.  Inactivity, poor dietary choices and obesity can cause diabetes, heart attack, stroke, and kidney damage, among others.    ALCOHOL AND SMOKING:  Women should limit their alcohol intake to no more than 7 drinks/beers/glasses of wine (combined, not each!) per week. Moderation of alcohol intake to this level decreases your risk of breast cancer and liver damage. And of course, no recreational drugs are part of a healthy lifestyle.  And absolutely no smoking or even second hand smoke. Most people know smoking can cause heart and lung diseases, but did you know it also contributes to weakening of your bones? Aging of your skin?  Yellowing of your teeth and nails?  CALCIUM AND VITAMIN D:  Adequate intake of calcium and Vitamin D are recommended.  The recommendations for exact amounts of these supplements seem to change often, but generally speaking 600 mg of calcium (either carbonate or citrate) and 800 units of Vitamin D per day seems prudent. Certain women may benefit from higher intake of Vitamin D.  If you are among these women, your doctor will have told you during your visit.    PAP SMEARS:  Pap smears, to check for cervical cancer or precancers,  have traditionally been done yearly, although recent scientific advances have shown that most women can have pap smears less often.  However, every woman still should have a physical exam from her gynecologist every year. It will include a breast check, inspection of the vulva and vagina to check for abnormal growths or skin changes, a visual exam of the cervix, and then an exam to evaluate the size and shape of the uterus and  ovaries.  And after 52 years of age, a rectal exam is indicated to check for rectal cancers. We will also provide age appropriate advice regarding health maintenance, like when you should have certain vaccines, screening for sexually transmitted diseases, bone density testing, colonoscopy, mammograms, etc.   MAMMOGRAMS:  All women over 40 years old should have a yearly mammogram. Many facilities now offer a "3D" mammogram, which may cost around $50 extra out of pocket. If possible,  we recommend you accept the option to have the 3D mammogram performed.  It both reduces the number of women who will be called back for extra views which then turn out to be normal, and it is better than the routine mammogram at detecting truly abnormal areas.    COLONOSCOPY:  Colonoscopy to screen for colon cancer is recommended for all women at age 50.  We know, you hate the idea of the prep.  We agree, BUT, having colon cancer and not knowing it is worse!!  Colon cancer so often starts as a polyp that can be seen and removed at colonscopy, which can quite literally save your life!  And if your first colonoscopy is normal and you have no family history of colon cancer, most women don't have to have it again for 10 years.  Once every ten years, you can do something that may end up saving your life, right?  We will be happy to help you get it scheduled when you are ready.    Be sure to check your insurance coverage so you understand how much it will cost.  It may be covered as a preventative service at no cost, but you should check your particular policy.    Red Yeast Rice Capsules What is this medication? RED YEAST RICE (red yeest rahys) may support healthy cholesterol levels. The FDA has not evaluated this supplement for any medical use. It may contain ingredients not listed. Discuss all supplements you are taking with your care team. They can provide you with important safety information. This medicine may be used for other  purposes; ask your health care provider or pharmacist if you have questions. COMMON BRAND NAME(S): Red Yeast Rice What should I tell my care team before I take this medication? They need to know if you have any of these conditions: Frequently drink alcohol Kidney disease Liver disease Muscle aches or weakness Other medical condition An unusual or allergic reaction to red yeast rice, went yeast, lovastatin, other 'statin' medications, other supplements, foods, dyes, or preservatives Pregnant or trying to get pregnant Breast-feeding How should I use this medication? Take this supplement by mouth with a glass of water. Follow the directions on the package labeling, or take as directed by your care team. Do not take this supplement more often than directed. Contact your care team about the use of this supplement in children. Special care may be needed. This supplement is not recommended for use in children. Overdosage: If you think you have taken too much of this medicine contact a poison control center or emergency room at once. NOTE: This medicine is only for you. Do not share this medicine with others. What if I miss a dose? If you miss a dose, take it as soon as you can. If it is almost time for your next dose, take only that dose. Do not take double or extra doses. What may interact with this medication? Do not take this medication with any of the following: Clarithromycin Delavirdine Erythromycin Grapefruit juice Protease inhibitors used to treat HIV infection Medications for fungal infections, such as itraconazole, ketoconazole, posaconazole, and voriconazole Mibefradil Nefazodone Other medications for high cholesterol Telithromycin Troleandomycin This medication may also interact with the following: Alcohol Amiodarone Colchicine Cyclosporine Danazol Diltiazem Fenofibrate Fluconazole Gemfibrozil Mifepristone, RU-486 Niacin [facility] John's  Wort Verapamil Voriconazole Warfarin This list may not describe all possible interactions. Give your health care provider a list of all the medicines, herbs, non-prescription drugs, or dietary supplements you use. Also tell them if you smoke, drink alcohol, or use illegal drugs. Some items may interact with your medicine. What should I watch for while using this medication? Visit your care team for regular check-ups. You may need regular tests to make sure your liver is working properly. Tell you care team right away if you get any unexplained muscle pain, tenderness, or weakness, especially if you also have a fever and tiredness. Some medications may increase the risk of side effects from this supplement. If you are given certain antibiotics or antifungals, you should stop taking this supplement during those treatments. Check with your care team or pharmacist for advice. If you are scheduled for any medical or dental procedure, tell your care team that you are taking this supplement. You may need to stop taking this supplement before the procedure. Do not use this supplement if you are pregnant or breast-feeding. Serious side effects to an unborn child or to an infant are possible. Talk to your care team or pharmacist for more information.  Herbal or dietary supplements are not regulated like medications. Rigid quality control standards are not required for dietary supplements. The purity and strength of these products can vary. The safety and effect of this dietary supplement for a certain disease or illness is not well known. This product is not intended to diagnose, treat, cure or prevent any disease. The Food and Drug Administration suggests the following to help consumers protect themselves: Always read product labels and follow directions. Natural does not mean a product is safe for humans to take. Look for products that include USP after the ingredient name. This means that the manufacturer  followed the standards of the [place] Pharmacopoeia. Supplements made or sold by a nationally known food or drug company are more likely to be made under tight controls. You can write to the company for more information about how the product was made. What side effects may I notice from receiving this medication? Side effects that you should report to your care team as soon as possible: Allergic reactions--skin rash, itching, hives, swelling of the face, lips, tongue, or throat Liver injury--right upper belly pain, loss of appetite, nausea, light-colored stool, dark yellow or brown urine, yellowing skin or eyes, unusual weakness or fatigue Muscle injury--unusual weakness or fatigue, muscle pain, dark yellow or brown urine, decrease in amount of urine Side effects that usually do not require medical attention (report to your care team if they continue or are bothersome): Dizziness Gas Headache Heartburn This list may not describe all possible side effects. Call your doctor for medical advice about side effects. You may report side effects to FDA at 1-800-FDA-1088. Where should I keep my medication? Keep out of the reach of children and pets. Store at room temperature between 15 and 30 degrees C (59 and 86 degrees F). Get rid of any unused supplement after the expiration date. NOTE: This sheet is a summary. It may not cover all possible information. If you have questions about this medicine, talk to your doctor, pharmacist, or health care provider.  2023 Elsevier/Gold Standard (2021-03-12 00:00:00)

## 2022-06-17 LAB — FOLLICLE STIMULATING HORMONE: FSH: 147.5 m[IU]/mL — ABNORMAL HIGH

## 2022-06-17 LAB — ESTRADIOL: Estradiol: <15 pg/mL

## 2022-07-27 ENCOUNTER — Other Ambulatory Visit (HOSPITAL_BASED_OUTPATIENT_CLINIC_OR_DEPARTMENT_OTHER): Payer: Self-pay

## 2022-07-27 ENCOUNTER — Ambulatory Visit (INDEPENDENT_AMBULATORY_CARE_PROVIDER_SITE_OTHER): Payer: BC Managed Care – PPO | Admitting: Student

## 2022-07-27 DIAGNOSIS — M545 Low back pain, unspecified: Secondary | ICD-10-CM | POA: Diagnosis not present

## 2022-07-27 MED ORDER — METHOCARBAMOL 500 MG PO TABS
500.0000 mg | ORAL_TABLET | Freq: Four times a day (QID) | ORAL | 0 refills | Status: AC
  Filled 2022-07-27: qty 28, 7d supply, fill #0

## 2022-07-27 NOTE — Progress Notes (Signed)
Chief Complaint: Low back pain     History of Present Illness:    Christine Crawford is a 52 y.o. female presenting to clinic for evaluation of left-sided low back pain.  She said this began about a week and a half ago after driving in a car for 5 hours followed by an intense tennis match that day.  She reports that lifting her left leg is particularly painful such as getting in and out of a car.  Her pain is typically been worse in the morning when she is first up and active.  Her pain today is mild and slightly better than yesterday however it was more painful this morning.  She has no prior history of back pain.  She has been taking some over-the-counter pain medicine that has been helping.  Denies any radiating pain, numbness, or tingling.  No bowel or bladder incontinence.   Surgical History:   None  PMH/PSH/Family History/Social History/Meds/Allergies:    Past Medical History:  Diagnosis Date   History of vaginal infection    Recurrents    HSV-1 infection    Past Surgical History:  Procedure Laterality Date   WISDOM TOOTH EXTRACTION     Social History   Socioeconomic History   Marital status: Married    Spouse name: Not on file   Number of children: Not on file   Years of education: Not on file   Highest education level: Not on file  Occupational History   Not on file  Tobacco Use   Smoking status: Never   Smokeless tobacco: Never  Vaping Use   Vaping Use: Never used  Substance and Sexual Activity   Alcohol use: Yes    Alcohol/week: 1.0 standard drink of alcohol    Types: 1 Glasses of wine per week   Drug use: No   Sexual activity: Yes    Partners: Male    Comment: husband's vasectomy  Other Topics Concern   Not on file  Social History Narrative   Not on file   Social Determinants of Health   Financial Resource Strain: Not on file  Food Insecurity: Not on file  Transportation Needs: Not on file  Physical Activity:  Not on file  Stress: Not on file  Social Connections: Not on file   Family History  Problem Relation Age of Onset   Colon cancer Mother 71   Healthy Father    Breast cancer Maternal Aunt    Breast cancer Paternal Aunt    Healthy Sister    Hyperlipidemia Brother    Healthy Daughter    Healthy Son    Stroke Other    Depression Neg Hx    Diabetes Neg Hx    Dementia Neg Hx    Colon polyps Neg Hx    Rectal cancer Neg Hx    Stomach cancer Neg Hx    No Known Allergies Current Outpatient Medications  Medication Sig Dispense Refill   methocarbamol (ROBAXIN) 500 MG tablet Take 1 tablet (500 mg total) by mouth 4 (four) times daily for 7 days. 28 tablet 0   Multiple Vitamins-Minerals (MULTIVITAMIN WOMEN PO) Take 1 tablet by mouth daily. (Patient not taking: Reported on 06/16/2022)     valACYclovir (VALTREX) 500 MG tablet Take 4 tablets (2,000 mg total) by mouth in the morning and  at bedtime. As needed for cold sore 20 tablet 2   No current facility-administered medications for this visit.   No results found.  Review of Systems:   A ROS was performed including pertinent positives and negatives as documented in the HPI.  Physical Exam :   Constitutional: NAD and appears stated age Neurological: Alert and oriented Psych: Appropriate affect and cooperative Last menstrual period 01/08/2020.   Comprehensive Musculoskeletal Exam:    No tenderness palpation over lumbar spine.  Mild tightness and tenderness throughout left lumbar region.  Full range of motion with flexion, extension, and rotation of the lumbar spine with some tightness and discomfort noted in flexion.  Passive range of motion of the left hip to 120 degrees flexion and 20 degrees of internal/external rotation.  Negative straight leg raise.  Imaging:    I personally reviewed and interpreted the radiographs.   Assessment:   52 y.o. female with left-sided low back pain.  Overall her symptoms do appear to be more muscular in  nature without evidence of any spinal pathology.  I think she would be a great candidate for physical therapy for stretching and strengthening of the low back along with other modalities.  Encouraged the use of heat therapy for muscle tension.  I will also send 1 week of methocarbamol to use as needed.  Will plan to see her back in clinic as necessary.  Plan :    -Referral to physical therapy and return to clinic as needed     I personally saw and evaluated the patient, and participated in the management and treatment plan.  Hazle Nordmann, PA-C Orthopedics  This document was dictated using Conservation officer, historic buildings. A reasonable attempt at proof reading has been made to minimize errors.

## 2022-08-03 NOTE — Therapy (Unsigned)
OUTPATIENT PHYSICAL THERAPY THORACOLUMBAR EVALUATION   Patient Name: Christine Crawford MRN: [id number] DOB:December 17, 1970, 52 y.o., female Today's Date: 08/04/2022  END OF SESSION:  PT End of Session - 08/04/22 0849     Visit Number 1    Number of Visits 16    Date for PT Re-Evaluation 10/02/22    PT Start Time 0803    PT Stop Time 0841    PT Time Calculation (min) 38 min    Activity Tolerance Patient tolerated treatment well    Behavior During Therapy [facility] for tasks assessed/performed             Past Medical History:  Diagnosis Date   History of vaginal infection    Recurrents    HSV-1 infection    Past Surgical History:  Procedure Laterality Date   WISDOM TOOTH EXTRACTION     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   Recurrent vaginitis 03/03/2018    PCP: Willow Ora, MD   REFERRING PROVIDER: Amador Cunas,   REFERRING DIAG: M54.50 (ICD-10-CM) - Acute left-sided low back pain, unspecified whether sciatica present   Rationale for Evaluation and Treatment: Rehabilitation  THERAPY DIAG:  Other low back pain  Muscle weakness (generalized)  Difficulty in walking, not elsewhere classified  ONSET DATE: 2 weeks ago after driving to [place]  SUBJECTIVE:                                                                                                                                                                                           SUBJECTIVE STATEMENT: Pt presenting to clinic for evaluation of left-sided low back pain.  She said this began about a week and a half ago after driving in a car for 5 hours followed by an intense tennis match that day.  She  reports that lifting her left leg is particularly painful such as getting in and out of a car.  Her pain is typically been worse in the morning when she is first up and active.  Her pain today is mild and slightly better than yesterday however it was more painful this morning.  She has no prior history of back pain.  She has been taking some over-the-counter pain medicine that has been helping.  Pt stating that she has questionable perception of numbness down her left thigh.  No  bowel or bladder symptoms.   PERTINENT HISTORY:  HSV-1 infection  PAIN:  NPRS scale: 1/10, worse pain 5-6/10 Pain location: low back, more on the left Pain description: achy Aggravating factors: lifting legs to get in/out car Relieving factors: over the counter meds  PRECAUTIONS: None  WEIGHT BEARING RESTRICTIONS: No  FALLS:  Has patient fallen in last 6 months? No  LIVING ENVIRONMENT: Lives with: lives with their family and lives with their spouse Lives in: House/apartment Stairs: Yes: Internal: 15 steps; on right going up Has following equipment at home: shower chair and None  OCCUPATION: [employer]  PLOF: Independent  PATIENT GOALS: Play tennis without pain, get in and out of car without pain  Next MD Visit: f/u as needed   OBJECTIVE:   DIAGNOSTIC FINDINGS:  No updated recent films  PATIENT SURVEYS:  08/04/22: FOTO eval:  70  predicted:  87  SCREENING FOR RED FLAGS: Bowel or bladder incontinence: No Cauda equina syndrome: No  COGNITION: Overall cognitive status: WFL normal      SENSATION: WFL  MUSCLE LENGTH: 08/04/22: Hamstrings: Right 78 deg; Left 70 deg   POSTURE: rounded shoulders and forward head  PALPATION: 08/04/22: TTP:   LUMBAR ROM:   AROM Eval 08/04/22:   Flexion 70  Extension 34  Right lateral flexion 32, pain down left side  Left lateral flexion 30  Right rotation WFL tightness noted   Left rotation WFL   (Blank rows = not tested)  LOWER  EXTREMITY ROM:     Active  Right 08/04/22 Left 08/04/22  Hip flexion 105 96 pain noted   Hip extension    Hip abduction    Hip adduction    Hip internal rotation    Hip external rotation    Knee flexion    Knee extension    Ankle dorsiflexion    Ankle plantarflexion    Ankle inversion    Ankle eversion     (Blank rows = not tested)  LOWER EXTREMITY MMT:    MMT Right eval Left eval  Hip flexion 5 4  Hip extension    Hip abduction    Hip adduction    Hip internal rotation    Hip external rotation    Knee flexion 5 5  Knee extension 5 5  Ankle dorsiflexion 5 5  Ankle plantarflexion 5 5  Ankle inversion    Ankle eversion     (Blank rows = not tested)  LUMBAR SPECIAL TESTS:  Slump test: Negative Rt and left  FUNCTIONAL TESTS:  08/04/22: 5 times sit to stand: 14 seconds, no UE support  GAIT: Distance walked: 30 feet into clinic Assistive device utilized: None Level of assistance: Complete Independence Comments: WFL  TODAY'S TREATMENT:                                                                                                                              DATE: 08/04/22:   Therex:  HEP instruction/performance c cues for techniques, handout provided.  Trial set performed of each for comprehension and symptom assessment.  See below for exercise list  PATIENT EDUCATION:  Education details: HEP, POC Person educated: Patient Education method: Explanation, Demonstration, Verbal cues, and Handouts Education comprehension: verbalized understanding, returned demonstration, and verbal cues required  HOME EXERCISE PROGRAM: Access Code: [email or web address] URL: [email or web address] Date: 08/04/2022 Prepared by: Narda Amber  Exercises - Supine Posterior Pelvic Tilt  - 2 x daily - 7 x weekly - 2 sets - 10 reps - 5 seconds hold - Supine Bridge  - 2 x daily - 7 x weekly - 2 sets - 10 reps - 5 seconds hold - Supine Lower Trunk Rotation  - 2 x daily - 7 x weekly -  3 sets - 10 reps - 20 seconds hold - Supine Figure 4 Piriformis Stretch  - 2 x daily - 7 x weekly - 3 reps - 20 seconds hold - Standing Thoracic Extension at Wall  - 2 x daily - 7 x weekly - 10 reps - 5 seconds hold  ASSESSMENT:  CLINICAL IMPRESSION: Patient is a 53 y.o. who comes to clinic with complaints of left sided low back pain. Pt stating pain is worse in the morning. Pt current taking over the counter meds. Pt presents with mobility, strength and movement coordination deficits that impair their ability to perform usual daily and recreational functional activities without increase difficulty/symptoms at this time.  Patient to benefit from skilled PT services to address impairments and limitations to improve to previous level of function without restriction secondary to condition.   OBJECTIVE IMPAIRMENTS: decreased mobility, difficulty walking, decreased ROM, decreased strength, impaired flexibility, and pain.   ACTIVITY LIMITATIONS: bending, squatting, and transfers  PARTICIPATION LIMITATIONS: driving, community activity, and occupation  PERSONAL FACTORS:  no specific factors  are affecting patient's functional outcome.   REHAB POTENTIAL: Excellent  CLINICAL DECISION MAKING: Stable/uncomplicated  EVALUATION COMPLEXITY: Low   GOALS: Goals reviewed with patient? Yes  SHORT TERM GOALS: (target date for Short term goals are 3 weeks 08/28/22)  1. Patient will demonstrate independent use of home exercise program to maintain progress from in clinic treatments.  Goal status: New  LONG TERM GOALS: (target dates for all long term goals are 8 weeks  10/02/22 )   1. Patient will demonstrate/report pain at worst less than or equal to 2/10 to facilitate minimal limitation in daily activity secondary to pain symptoms.  Goal status: New   2. Patient will demonstrate independent use of home exercise program to facilitate ability to maintain/progress functional gains from skilled physical  therapy services.  Goal status: New   3. Patient will demonstrate FOTO outcome > or = 87 % to indicate reduced disability due to condition.  Goal status: New   4. Patient will demonstrate lumbar extension 100 % WFL s symptoms to facilitate upright standing, walking posture at PLOF s limitation.  Goal status: New   5.  Pt will be able to transfer in/out of her vehicle with no pain reported in her left low back and left hip.    Goal status: New   6.  Pt will be able to report playing full round of tennis with pain </= 2/10.   Goal status: New     PLAN:  PT FREQUENCY: 1-2x/week  PT DURATION: 8 weeks  PLANNED INTERVENTIONS: Therapeutic exercises, Therapeutic activity, Neuro Muscular re-education, Balance training, Gait training, Patient/Family education, Joint mobilization, Stair training, DME instructions, Dry Needling, Electrical  stimulation, Cryotherapy, vasopneumatic device, Moist heat, Taping, Traction Ultrasound, Ionotophoresis 4mg /ml Dexamethasone, and aquatic therapy, Manual therapy.  All included unless contraindicated  PLAN FOR NEXT SESSION: Review HEP knowledge/results, lumbar mobs, extension exercises, core strengthening, lumbar stretching,       Sharmon Leyden, PT, MPT 08/04/2022, 8:50 AM

## 2022-08-04 ENCOUNTER — Other Ambulatory Visit: Payer: Self-pay

## 2022-08-04 ENCOUNTER — Encounter: Payer: Self-pay | Admitting: Physical Therapy

## 2022-08-04 ENCOUNTER — Ambulatory Visit (INDEPENDENT_AMBULATORY_CARE_PROVIDER_SITE_OTHER): Payer: BC Managed Care – PPO | Admitting: Physical Therapy

## 2022-08-04 DIAGNOSIS — M6281 Muscle weakness (generalized): Secondary | ICD-10-CM | POA: Diagnosis not present

## 2022-08-04 DIAGNOSIS — R262 Difficulty in walking, not elsewhere classified: Secondary | ICD-10-CM

## 2022-08-04 DIAGNOSIS — M5459 Other low back pain: Secondary | ICD-10-CM

## 2022-08-11 ENCOUNTER — Ambulatory Visit (INDEPENDENT_AMBULATORY_CARE_PROVIDER_SITE_OTHER): Payer: BC Managed Care – PPO | Admitting: Physical Therapy

## 2022-08-11 ENCOUNTER — Encounter: Payer: Self-pay | Admitting: Physical Therapy

## 2022-08-11 DIAGNOSIS — M5459 Other low back pain: Secondary | ICD-10-CM

## 2022-08-11 DIAGNOSIS — R262 Difficulty in walking, not elsewhere classified: Secondary | ICD-10-CM

## 2022-08-11 DIAGNOSIS — M6281 Muscle weakness (generalized): Secondary | ICD-10-CM

## 2022-08-11 NOTE — Therapy (Addendum)
OUTPATIENT PHYSICAL THERAPY TREATMENT/ DISCHARGE   Patient Name: Christine Crawford MRN: [id number] DOB:[birth year], 52 y.o., female Today's Date: 08/11/2022  END OF SESSION:  PT End of Session - 08/11/22 0816     Visit Number 2    Number of Visits 16    Date for PT Re-Evaluation 10/02/22    PT Start Time 0803    PT Stop Time 0843    PT Time Calculation (min) 40 min    Activity Tolerance Patient tolerated treatment well    Behavior During Therapy [facility] for tasks assessed/performed              Past Medical History:  Diagnosis Date   History of vaginal infection    Recurrents    HSV-1 infection    Past Surgical History:  Procedure Laterality Date   WISDOM TOOTH EXTRACTION     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   Recurrent vaginitis 03/03/2018    PCP: Willow Ora, MD   REFERRING PROVIDER: Amador Cunas,   REFERRING DIAG: M54.50 (ICD-10-CM) - Acute left-sided low back pain, unspecified whether sciatica present   Rationale for Evaluation and Treatment: Rehabilitation  THERAPY DIAG:  Other low back pain  Muscle weakness (generalized)  Difficulty in walking, not elsewhere classified  ONSET DATE: 2 weeks ago after driving to [place]  SUBJECTIVE:                                                                                                                                                                                           SUBJECTIVE STATEMENT: Pt stating her pain today is 2/10. Pt stating pain is more annoying. Pt reporting compliance in her HEP.   PERTINENT HISTORY:  HSV-1 infection  PAIN:  NPRS scale: 2/10, worse pain 5-6/10 Pain location:  low back, more on the left Pain description: achy Aggravating factors: lifting legs to get in/out car Relieving factors: over the counter meds  PRECAUTIONS: None  WEIGHT BEARING RESTRICTIONS: No  FALLS:  Has patient fallen in last 6 months? No  LIVING ENVIRONMENT: Lives with: lives with their family and lives with their spouse Lives in: House/apartment Stairs: Yes: Internal: 15 steps; on right going up Has following equipment at home: shower chair and None  OCCUPATION: [employer]  PLOF: Independent  PATIENT GOALS: Play tennis without pain, get  in and out of car without pain  Next MD Visit: f/u as needed   OBJECTIVE:   DIAGNOSTIC FINDINGS:  No updated recent films  PATIENT SURVEYS:  08/04/22: FOTO eval:  70  predicted:  87  SCREENING FOR RED FLAGS: Bowel or bladder incontinence: No Cauda equina syndrome: No  COGNITION: Overall cognitive status: WFL normal      SENSATION: WFL  MUSCLE LENGTH: 08/04/22: Hamstrings: Right 78 deg; Left 70 deg   POSTURE: rounded shoulders and forward head  PALPATION: 08/04/22: TTP:   LUMBAR ROM:   AROM Eval 08/04/22:   Flexion 70  Extension 34  Right lateral flexion 32, pain down left side  Left lateral flexion 30  Right rotation WFL tightness noted   Left rotation WFL   (Blank rows = not tested)  LOWER EXTREMITY ROM:     Active  Right 08/04/22 Left 08/04/22  Hip flexion 105 96 pain noted   Hip extension    Hip abduction    Hip adduction    Hip internal rotation    Hip external rotation    Knee flexion    Knee extension    Ankle dorsiflexion    Ankle plantarflexion    Ankle inversion    Ankle eversion     (Blank rows = not tested)  LOWER EXTREMITY MMT:    MMT Right eval Left eval  Hip flexion 5 4  Hip extension    Hip abduction    Hip adduction    Hip internal rotation    Hip external rotation    Knee flexion 5 5  Knee extension 5 5  Ankle dorsiflexion 5 5  Ankle  plantarflexion 5 5  Ankle inversion    Ankle eversion     (Blank rows = not tested)  LUMBAR SPECIAL TESTS:  Slump test: Negative Rt and left  FUNCTIONAL TESTS:  08/04/22: 5 times sit to stand: 14 seconds, no UE support  GAIT: Distance walked: 30 feet into clinic Assistive device utilized: None Level of assistance: Complete Independence Comments: WFL  TODAY'S TREATMENT:                                                                                                                              DATE:  08/11/22:  TherEx:  Recumbent bike: Level 2 x 3 minutes PPT: x 5 holding 5 sec PPT c bridge: x 5 holding 5 sec Trunk rotation: x 4 holding 30 sec SKTC: x 3 holding 20 sec bil LE Piriformis stretch: x 2 each LE holding 20 sec Glute stretch: x 2 each LE holding 20 sec Prone press up during STM Prone hip extension: x 10 each LE Manual:  Left hip long axis distraction x 3  STM: left QL and left lateral hip Percussion device: left QL and left glutes, left lateral hip Modalities:  Moist heat: x 5 minutes, left hip, low back    08/04/22:   Therex:    HEP  instruction/performance c cues for techniques, handout provided.  Trial set performed of each for comprehension and symptom assessment.  See below for exercise list  PATIENT EDUCATION:  Education details: HEP, POC Person educated: Patient Education method: Explanation, Demonstration, Verbal cues, and Handouts Education comprehension: verbalized understanding, returned demonstration, and verbal cues required  HOME EXERCISE PROGRAM: Access Code: [email or web address] URL: [email or web address] Date: 08/11/2022 Prepared by: Narda Amber  Exercises - Supine Posterior Pelvic Tilt  - 2 x daily - 7 x weekly - 2 sets - 10 reps - 5 seconds hold - Supine Bridge  - 2 x daily - 7 x weekly - 2 sets - 10 reps - 5 seconds hold - Supine Lower Trunk Rotation  - 2 x daily - 7 x weekly - 3 sets - 10 reps - 20 seconds hold - Supine Figure 4  Piriformis Stretch  - 2 x daily - 7 x weekly - 3 reps - 20 seconds hold - Standing Thoracic Extension at Wall  - 2 x daily - 7 x weekly - 10 reps - 5 seconds hold - Hooklying Single Knee to Chest Stretch  - 2 x daily - 7 x weekly - 3 reps - 30 seconds hold - Supine Piriformis Stretch with Foot on Ground  - 2 x daily - 7 x weekly - 3 reps - 30 seconds hold - Prone Hip Extension  - 2 x daily - 7 x weekly - 1-2 sets - 10 reps  ASSESSMENT:  CLINICAL IMPRESSION: Pt arriving today with 2/10 pain in her left hip, low back. Pt reporting compliance in her HEP but has noticed no change in her symptoms. Pt tolerating all exercises well today with good response to STM and manual therapy. Continue to progress with skilled interventions to maximize pt's function.      OBJECTIVE IMPAIRMENTS: decreased mobility, difficulty walking, decreased ROM, decreased strength, impaired flexibility, and pain.   ACTIVITY LIMITATIONS: bending, squatting, and transfers  PARTICIPATION LIMITATIONS: driving, community activity, and occupation  PERSONAL FACTORS:  no specific factors  are affecting patient's functional outcome.   REHAB POTENTIAL: Excellent  CLINICAL DECISION MAKING: Stable/uncomplicated  EVALUATION COMPLEXITY: Low   GOALS: Goals reviewed with patient? Yes  SHORT TERM GOALS: (target date for Short term goals are 3 weeks 08/28/22)  1. Patient will demonstrate independent use of home exercise program to maintain progress from in clinic treatments.  Goal status: on-going 08/11/22  LONG TERM GOALS: (target dates for all long term goals are 8 weeks  10/02/22 )   1. Patient will demonstrate/report pain at worst less than or equal to 2/10 to facilitate minimal limitation in daily activity secondary to pain symptoms.  Goal status: New   2. Patient will demonstrate independent use of home exercise program to facilitate ability to maintain/progress functional gains from skilled physical therapy  services.  Goal status: New   3. Patient will demonstrate FOTO outcome > or = 87 % to indicate reduced disability due to condition.  Goal status: New   4. Patient will demonstrate lumbar extension 100 % WFL s symptoms to facilitate upright standing, walking posture at PLOF s limitation.  Goal status: New   5.  Pt will be able to transfer in/out of her vehicle with no pain reported in her left low back and left hip.    Goal status: New   6.  Pt will be able to report playing full round of tennis with pain </= 2/10.   Goal status: New  PLAN:  PT FREQUENCY: 1-2x/week  PT DURATION: 8 weeks  PLANNED INTERVENTIONS: Therapeutic exercises, Therapeutic activity, Neuro Muscular re-education, Balance training, Gait training, Patient/Family education, Joint mobilization, Stair training, DME instructions, Dry Needling, Electrical stimulation, Cryotherapy, vasopneumatic device, Moist heat, Taping, Traction Ultrasound, Ionotophoresis 4mg /ml Dexamethasone, and aquatic therapy, Manual therapy.  All included unless contraindicated  PLAN FOR NEXT SESSION: lumbar mobs, extension exercises, core strengthening, lumbar stretching, response to long axis distraction, manual therapy as needed   Sharmon Leyden, PT, MPT 08/11/2022, 8:41 AM   PHYSICAL THERAPY DISCHARGE SUMMARY  Visits from Start of Care: 2  Current functional level related to goals / functional outcomes: See note   Remaining deficits: See note   Education / Equipment: HEP  Patient goals were partially met. Patient is being discharged due to not returning since the last visit.

## 2022-08-18 ENCOUNTER — Encounter: Payer: BC Managed Care – PPO | Admitting: Physical Therapy

## 2022-08-25 ENCOUNTER — Encounter: Payer: BC Managed Care – PPO | Admitting: Physical Therapy

## 2022-09-01 ENCOUNTER — Encounter: Payer: BC Managed Care – PPO | Admitting: Physical Therapy

## 2022-09-09 ENCOUNTER — Telehealth: Payer: BC Managed Care – PPO | Admitting: Physician Assistant

## 2022-09-09 DIAGNOSIS — H00014 Hordeolum externum left upper eyelid: Secondary | ICD-10-CM | POA: Diagnosis not present

## 2022-09-09 MED ORDER — ERYTHROMYCIN 5 MG/GM OP OINT: 1.0000 | TOPICAL_OINTMENT | Freq: Two times a day (BID) | OPHTHALMIC | 0 refills | Status: DC

## 2022-09-09 NOTE — Progress Notes (Signed)
  E-Visit for Stye   We are sorry that you are not feeling well. Here is how we plan to help!  Based on what you have shared with me it looks like you have a stye.  A stye is an inflammation of the eyelid.  It is often a red, painful lump near the edge of the eyelid that may look like a boil or a pimple.  A stye develops when an infection occurs at the base of an eyelash.   We have made appropriate suggestions for you based upon your presentation: Simple styes can be treated without medical intervention.  Most styes either resolve spontaneously or resolve with simple home treatment by applying warm compresses or heated washcloth to the stye for about 10-15 minutes three to four times a day. This causes the stye to drain and resolve. I have also prescribed Erythromycin ointment to apply to the affected eyelid twice daily for 5 days.  HOME CARE:  Wash your hands often! Let the stye open on its own. Don't squeeze or open it. Don't rub your eyes. This can irritate your eyes and let in bacteria.  If you need to touch your eyes, wash your hands first. Don't wear eye makeup or contact lenses until the area has healed.  GET HELP RIGHT AWAY IF:  Your symptoms do not improve. You develop blurred or loss of vision. Your symptoms worsen (increased discharge, pain or redness).   Thank you for choosing an e-visit.  Your e-visit answers were reviewed by a board certified advanced clinical practitioner to complete your personal care plan. Depending upon the condition, your plan could have included both over the counter or prescription medications.  Please review your pharmacy choice. Make sure the pharmacy is open so you can pick up prescription now. If there is a problem, you may contact your provider through [employer] and have the prescription routed to another pharmacy.  Your safety is important to [place]. If you have drug allergies check your prescription carefully.   For the next 24 hours you  can use MyChart to ask questions about today's visit, request a non-urgent call back, or ask for a work or school excuse. You will get an email in the next two days asking about your experience. I hope that your e-visit has been valuable and will speed your recovery.   I have spent 5 minutes in review of e-visit questionnaire, review and updating patient chart, medical decision making and response to patient.   Margaretann Loveless, PA-C

## 2022-12-22 ENCOUNTER — Telehealth (INDEPENDENT_AMBULATORY_CARE_PROVIDER_SITE_OTHER): Payer: BC Managed Care – PPO | Admitting: Physician Assistant

## 2022-12-22 DIAGNOSIS — U071 COVID-19: Secondary | ICD-10-CM

## 2022-12-22 NOTE — Progress Notes (Signed)
   Virtual Visit via Video Note  I connected with  Christine Crawford  on 12/22/22 at  9:30 AM EDT by a video enabled telemedicine application and verified that I am speaking with the correct person using two identifiers.  Location: Patient: home Provider: Nature conservation officer at [employer] Persons present: Patient and myself   I discussed the limitations of evaluation and management by telemedicine and the availability of in person appointments. The patient expressed understanding and agreed to proceed.   History of Present Illness:  Chief complaint: COVID-19 positive - home test 12/21/22 Symptom onset: 12/19/22 Pertinent positives: congestion, sinus infection symptoms, cough, and sore throat.  Pertinent negatives: Fever, chills, chest pain, SOB, GI sx's  Treatments tried: Nyquil, ibuprofen  Vaccine status: Normally has COVID-19 vaccines, didn't have latest booster  Sick exposure: Son also having COVID symptoms; works with [employer]    Observations/Objective:   Gen: Awake, alert, no acute distress, congested sounding  Resp: Breathing is even and non-labored Psych: calm/pleasant demeanor Neuro: Alert and Oriented x 3, + facial symmetry, speech is clear.   Assessment and Plan:  1. COVID-19 Diagnosis confirmed via home antigen test.  Patient is currently having mild symptoms.  We discussed current algorithm recommendations for prescribing outpatient antivirals.  As the patient is not severely immunocompromised, under the age of 52, antiviral treatment is not indicated at this time. Treat supportively at this time including sleeping prone, deep breathing exercises, pushing fluids, walking every few hours, vitamins C and D, and Tylenol or ibuprofen as needed.  The patient understands that COVID-19 illness can wax and wane.  Should the symptoms acutely worsen or patient starts to experience sudden shortness of breath, chest pain, severe weakness, the patient will  go straight to the emergency department.  Also advised home pulse oximetry monitoring and for any reading consistently under 92%, should also report to the emergency department.  The patient will continue to keep [place] updated.    Follow Up Instructions:    I discussed the assessment and treatment plan with the patient. The patient was provided an opportunity to ask questions and all were answered. The patient agreed with the plan and demonstrated an understanding of the instructions.   The patient was advised to call back or seek an in-person evaluation if the symptoms worsen or if the condition fails to improve as anticipated.  Aryannah Mohon M Lysha Schrade, PA-C

## 2023-01-07 ENCOUNTER — Inpatient Hospital Stay (HOSPITAL_BASED_OUTPATIENT_CLINIC_OR_DEPARTMENT_OTHER): Admission: RE | Admit: 2023-01-07 | Payer: BC Managed Care – PPO | Source: Ambulatory Visit | Admitting: Radiology

## 2023-01-07 DIAGNOSIS — Z1231 Encounter for screening mammogram for malignant neoplasm of breast: Secondary | ICD-10-CM

## 2023-01-09 ENCOUNTER — Ambulatory Visit (HOSPITAL_BASED_OUTPATIENT_CLINIC_OR_DEPARTMENT_OTHER)
Admission: RE | Admit: 2023-01-09 | Discharge: 2023-01-09 | Disposition: A | Discharge status: Home / Self Care | Payer: BC Managed Care – PPO | Source: Ambulatory Visit | Attending: Family Medicine | Admitting: Family Medicine

## 2023-01-09 DIAGNOSIS — Z1231 Encounter for screening mammogram for malignant neoplasm of breast: Secondary | ICD-10-CM | POA: Diagnosis present

## 2023-01-25 ENCOUNTER — Ambulatory Visit (INDEPENDENT_AMBULATORY_CARE_PROVIDER_SITE_OTHER): Payer: BC Managed Care – PPO | Admitting: Sports Medicine

## 2023-01-25 ENCOUNTER — Other Ambulatory Visit (INDEPENDENT_AMBULATORY_CARE_PROVIDER_SITE_OTHER): Payer: Self-pay

## 2023-01-25 ENCOUNTER — Encounter: Payer: Self-pay | Admitting: Sports Medicine

## 2023-01-25 DIAGNOSIS — M25521 Pain in right elbow: Secondary | ICD-10-CM

## 2023-01-25 DIAGNOSIS — M7711 Lateral epicondylitis, right elbow: Secondary | ICD-10-CM

## 2023-01-25 MED ORDER — MELOXICAM 15 MG PO TABS: 15.0000 mg | ORAL_TABLET | Freq: Every day | ORAL | 0 refills | Status: DC

## 2023-01-25 MED ORDER — METHYLPREDNISOLONE 4 MG PO TBPK: ORAL_TABLET | ORAL | 0 refills | Status: DC

## 2023-01-25 NOTE — Progress Notes (Signed)
Christine Crawford - 52 y.o. female MRN [id number]  Date of birth: [birth year]  Office Visit Note: Visit Date: 01/25/2023 PCP: Willow Ora, MD Referred by: Amador Cunas, PA*  Subjective: Chief Complaint  Patient presents with   Right Elbow - Pain   HPI: Christine Crawford is a pleasant 52 y.o. female who presents today for acute on chronic right elbow pain. She is RHD.  She has been experiencing right lateral elbow pain for about 2 months.  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  Denies any swelling but has some pain with full range extension.  She has a history of tennis elbow a few years ago and it sounds similar to this.  Was previously treated with anti-inflammatories.  She is not doing any formal PT or rehab.  Denies any numbness or tingling.  She has been able to continue playing tennis, does wear a elbow brace.  Pertinent ROS were reviewed with the patient and found to be negative unless otherwise specified above in HPI.   Assessment & Plan: Visit Diagnoses:  1. Pain in right elbow   2. 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  She does have some pain with endrange extension which could simply be irritation from the epicondylitis versus a degree of valgus extension overload.  We discussed all treatment options such as oral medication, formal PT versus home rehab, bracing, injection therapy.  We will start her on a 6-day course of Medrol Dosepak, followed by a few weeks of meloxicam 15 mg once daily.  She will perform activity modification, ice as able.  After the first week, I would like her to get into therapy, she prefers home therapy.  We did print out a customized handout of rehab exercises for her to perform.  My athletic trainer, Isabelle Course did review images in the room with her which she  will perform once daily after completion of the Medrol Dosepak.  She may continue playing tennis at that time as her pain allows, although would recommend counterforce bracing.  We will follow-up in about 1 month for reevaluation.  Follow-up: Return in about 1 month (around 02/25/2023) for R-elbow f/u.   Meds & Orders:  Meds ordered this encounter  Medications   methylPREDNISolone (MEDROL DOSEPAK) 4 MG TBPK tablet    Sig: Take per packet instructions. Taper dosing.    Dispense:  1 each    Refill:  0   meloxicam (MOBIC) 15 MG tablet    Sig: Take 1 tablet (15 mg total) by mouth daily. Take AFTER completing prednisone    Dispense:  30 tablet    Refill:  0    Orders Placed This Encounter  Procedures   XR Elbow 2 Views Right     Procedures: No procedures performed      Clinical History: No specialty comments available.  She reports that she has never smoked. She has never used smokeless tobacco.  Recent Labs    04/29/22 0951  HGBA1C 5.6    Objective:   Vital Signs: LMP 01/08/2020 (Approximate)   Physical Exam  Gen: Well-appearing, in no acute distress; non-toxic CV: Regular Rate. Well-perfused. Warm.  Resp: Breathing unlabored on room air; no wheezing. Psych: Fluid speech in conversation; appropriate affect; normal thought process Neuro: Sensation intact throughout. No gross coordination deficits.  Ortho Exam - Right elbow: + TTP over the lateral epicondyle and just distal to this.  There is full range of motion of the elbow with flexion and extension, no varus or valgus instability.  Positive Maudsley sign, pain with resisted supination.  No gross weakness of the forearm or elbow extensors.  Imaging: XR Elbow 2 Views Right  Result Date: 01/25/2023 2 views of the right elbow including AP and lateral film were ordered and reviewed by myself.  There is no acute bony fracture, no significant arthritic change.  There is a very small spur off the superior aspect of the  lateral epicondyle.  No joint effusion.   Past Medical/Family/Surgical/Social History: Medications & Allergies reviewed per EMR, new medications updated. 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   Recurrent vaginitis 03/03/2018   Past Medical History:  Diagnosis Date   History of vaginal infection    Recurrents    HSV-1 infection    Family History  Problem Relation Age of Onset   Colon cancer Mother 59   Healthy Father    Breast cancer Maternal Aunt    Breast cancer Paternal Aunt    Healthy Sister    Hyperlipidemia Brother    Healthy Daughter    Healthy Son    Stroke Other    Depression Neg Hx    Diabetes Neg Hx    Dementia Neg Hx    Colon polyps Neg Hx    Rectal cancer Neg Hx    Stomach cancer Neg Hx    Past Surgical History:  Procedure Laterality Date   WISDOM TOOTH EXTRACTION     Social History   Occupational History   Not on file  Tobacco Use   Smoking status: Never   Smokeless tobacco: Never  Vaping Use   Vaping status: Never Used  Substance and Sexual Activity   Alcohol use: Yes    Alcohol/week: 1.0 standard drink of alcohol    Types: 1 Glasses of wine per week   Drug use: No   Sexual activity: Yes    Partners: Male    Comment: husband's vasectomy

## 2023-01-25 NOTE — Progress Notes (Signed)
Patient says that she has been having elbow pain for about 2 months. She has had tennis elbow before and says that this feels like that. She plays tennis 2-3 times a week. The pain is in her right elbow, and is worst when serving. She says that it is painful after she is done playing, and she is unable to fully extend her right arm due to pain.   Patient was instructed in 10 minutes of therapeutic exercises for right elbow to improve strength, ROM and function according to my instructions and plan of care by a Certified Athletic Trainer during the office visit. A customized handout was provided and demonstration of proper technique shown and discussed. Patient did perform exercises and demonstrate understanding through teachback.  All questions discussed and answered.

## 2023-03-01 ENCOUNTER — Ambulatory Visit (INDEPENDENT_AMBULATORY_CARE_PROVIDER_SITE_OTHER): Payer: BC Managed Care – PPO | Admitting: Sports Medicine

## 2023-03-01 ENCOUNTER — Ambulatory Visit: Payer: BC Managed Care – PPO | Admitting: Sports Medicine

## 2023-03-01 ENCOUNTER — Encounter: Payer: Self-pay | Admitting: Sports Medicine

## 2023-03-01 DIAGNOSIS — M7711 Lateral epicondylitis, right elbow: Secondary | ICD-10-CM | POA: Diagnosis not present

## 2023-03-01 NOTE — Progress Notes (Signed)
Patient says that she is feeling better, although she is concerned that pain will return once she is done taking the Meloxicam. She says that she has played tennis some but not as much and still wears a strap on the forearm while playing. She has been doing her home exercises. She says that every once and awhile she will feel some pain but it is overall better than it was last visit.

## 2023-03-01 NOTE — Progress Notes (Signed)
Christine Crawford - 52 y.o. female MRN [id number]  Date of birth: [birth year]  Office Visit Note: Visit Date: 03/01/2023 PCP: Willow Ora, MD Referred by: Willow Ora, MD  Subjective: Chief Complaint  Patient presents with   Right Elbow - Follow-up   HPI: Christine Crawford is a pleasant 52 y.o. 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  Christine Crawford is doing significantly better.  She does have a few doses left of her meloxicam.  Currently she has little to essentially no pain at the elbow at rest.  She has been able to play some tennis but is certainly less active given that this is now becoming the off season.  She does wear her counterforce brace when doing this.  She has done some of her home exercises but does admit she has not been as adherent or consistent with these.  Overall though, is feeling much better.  Pertinent ROS were reviewed with the patient and found to be negative unless otherwise specified above in HPI.   Assessment & Plan: Visit Diagnoses:  1. Lateral epicondylitis, right elbow    Plan: Discussed with Christine Crawford she has made great improvements in her pain and function from her lateral epicondylitis.  She did complete a 6-day Medrol Dosepak and does have a few days left of her meloxicam 15mg  qd prescription.  I would like her to finish her meloxicam course to completion and then after this, she will stop all anti-inflammatory medication.  We did discuss the importance of continuing her home rehab exercises for the lateral epicondyle and CETs.  She will begin these alternating daily exercises.  I would like her to continue this for 1 month after she has complete resolution of her pain to help prevent reoccurrence.  If she is playing tennis, she will continue in her counterforce brace.  She will follow-up with me only as needed.  Follow-up: Return if symptoms worsen or fail to improve.   Meds & Orders: No  orders of the defined types were placed in this encounter.  No orders of the defined types were placed in this encounter.    Procedures: No procedures performed      Clinical History: No specialty comments available.  She reports that she has never smoked. She has never used smokeless tobacco.  Recent Labs    04/29/22 0951  HGBA1C 5.6    Objective:   Vital Signs: LMP 01/08/2020 (Approximate)   Physical Exam  Gen: Well-appearing, in no acute distress; non-toxic CV: Well-perfused. Warm.  Resp: Breathing unlabored on room air; no wheezing. Psych: Fluid speech in conversation; appropriate affect; normal thought process Neuro: Sensation intact throughout. No gross coordination deficits.   Ortho Exam - Right elbow: There is no tenderness over the lateral epicondyle or the origin of the common extensor tendons.  There is full range of motion in flexion and extension.  No varus or valgus instability.  Negative Maudsley's sign, no pain with resisted supination or pronation.  Imaging: No results found.  Past Medical/Family/Surgical/Social History: Medications & Allergies reviewed per EMR, new medications updated. 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   Recurrent vaginitis 03/03/2018   Past  Medical History:  Diagnosis Date   History of vaginal infection    Recurrents    HSV-1 infection    Family History  Problem Relation Age of Onset   Colon cancer Mother 25   Healthy Father    Breast cancer Maternal Aunt    Breast cancer Paternal Aunt    Healthy Sister    Hyperlipidemia Brother    Healthy Daughter    Healthy Son    Stroke Other    Depression Neg Hx    Diabetes Neg Hx     Dementia Neg Hx    Colon polyps Neg Hx    Rectal cancer Neg Hx    Stomach cancer Neg Hx    Past Surgical History:  Procedure Laterality Date   WISDOM TOOTH EXTRACTION     Social History   Occupational History   Not on file  Tobacco Use   Smoking status: Never   Smokeless tobacco: Never  Vaping Use   Vaping status: Never Used  Substance and Sexual Activity   Alcohol use: Yes    Alcohol/week: 1.0 standard drink of alcohol    Types: 1 Glasses of wine per week   Drug use: No   Sexual activity: Yes    Partners: Male    Comment: husband's vasectomy

## 2023-05-05 ENCOUNTER — Encounter: Payer: Self-pay | Admitting: Family Medicine

## 2023-05-05 ENCOUNTER — Ambulatory Visit: Payer: 59 | Admitting: Family Medicine

## 2023-05-05 VITALS — BP 120/78 | HR 61 | Temp 98.7°F | Ht 64.0 in | Wt 151.4 lb

## 2023-05-05 DIAGNOSIS — H6123 Impacted cerumen, bilateral: Secondary | ICD-10-CM

## 2023-05-05 DIAGNOSIS — Z800 Family history of malignant neoplasm of digestive organs: Secondary | ICD-10-CM | POA: Diagnosis not present

## 2023-05-05 DIAGNOSIS — E038 Other specified hypothyroidism: Secondary | ICD-10-CM

## 2023-05-05 DIAGNOSIS — Z0001 Encounter for general adult medical examination with abnormal findings: Secondary | ICD-10-CM

## 2023-05-05 DIAGNOSIS — L989 Disorder of the skin and subcutaneous tissue, unspecified: Secondary | ICD-10-CM

## 2023-05-05 DIAGNOSIS — Z0000 Encounter for general adult medical examination without abnormal findings: Secondary | ICD-10-CM

## 2023-05-05 DIAGNOSIS — Z13220 Encounter for screening for lipoid disorders: Secondary | ICD-10-CM | POA: Diagnosis not present

## 2023-05-05 LAB — COMPREHENSIVE METABOLIC PANEL
ALT: 13 U/L (ref 0–35)
AST: 20 U/L (ref 0–37)
Albumin: 4.3 g/dL (ref 3.5–5.2)
Alkaline Phosphatase: 67 U/L (ref 39–117)
BUN: 17 mg/dL (ref 6–23)
CO2: 30 meq/L (ref 19–32)
Calcium: 9.5 mg/dL (ref 8.4–10.5)
Chloride: 104 meq/L (ref 96–112)
Creatinine, Ser: 0.72 mg/dL (ref 0.40–1.20)
GFR: 95.8 mL/min (ref >60.00)
Glucose, Bld: 83 mg/dL (ref 70–99)
Potassium: 3.8 meq/L (ref 3.5–5.1)
Sodium: 141 meq/L (ref 135–145)
Total Bilirubin: 0.5 mg/dL (ref 0.2–1.2)
Total Protein: 7.1 g/dL (ref 6.0–8.3)

## 2023-05-05 LAB — T4, FREE: Free T4: 0.64 ng/dL (ref 0.60–1.60)

## 2023-05-05 LAB — CBC WITH DIFFERENTIAL/PLATELET
Basophils Absolute: 0 10*3/uL (ref 0.0–0.1)
Basophils Relative: 0.6 % (ref 0.0–3.0)
Eosinophils Absolute: 0.1 10*3/uL (ref 0.0–0.7)
Eosinophils Relative: 2.3 % (ref 0.0–5.0)
HCT: 40.4 % (ref 36.0–46.0)
Hemoglobin: 13.2 g/dL (ref 12.0–15.0)
Lymphocytes Relative: 33.5 % (ref 12.0–46.0)
Lymphs Abs: 1.9 10*3/uL (ref 0.7–4.0)
MCHC: 32.7 g/dL (ref 30.0–36.0)
MCV: 88.6 fL (ref 78.0–100.0)
Monocytes Absolute: 0.5 10*3/uL (ref 0.1–1.0)
Monocytes Relative: 9.9 % (ref 3.0–12.0)
Neutro Abs: 3 10*3/uL (ref 1.4–7.7)
Neutrophils Relative %: 53.7 % (ref 43.0–77.0)
Platelets: 249 10*3/uL (ref 150.0–400.0)
RBC: 4.56 Mil/uL (ref 3.87–5.11)
RDW: 13.9 % (ref 11.5–15.5)
WBC: 5.5 10*3/uL (ref 4.0–10.5)

## 2023-05-05 LAB — LIPID PANEL
Cholesterol: 255 mg/dL — ABNORMAL HIGH (ref 0–200)
HDL: 87 mg/dL (ref >39.00)
LDL Cholesterol: 153 mg/dL — ABNORMAL HIGH (ref 0–99)
NonHDL: 167.7
Total CHOL/HDL Ratio: 3
Triglycerides: 72 mg/dL (ref 0.0–149.0)
VLDL: 14.4 mg/dL (ref 0.0–40.0)

## 2023-05-05 LAB — TSH: TSH: 3.08 u[IU]/mL (ref 0.35–5.50)

## 2023-05-05 NOTE — Patient Instructions (Signed)
Please return in 12 months for your annual complete physical; please come fasting.   I will release your lab results to you on your MyChart account with further instructions. You may see the results before I do, but when I review them I will send you a message with my report or have my assistant call you if things need to be discussed. Please reply to my message with any questions. Thank you!   If you have any questions or concerns, please don't hesitate to send me a message via MyChart or call the office at [phone]. Thank you for visiting with [place] today! It's our pleasure caring for you.   Cryosurgery for Skin Conditions Cryosurgery, also called cryotherapy, is the use of extremely cold liquid (liquid nitrogen) to freeze and remove abnormal tissue. Cryosurgery may be used to remove certain growths on the skin, such as: Warts. Skin sores that could turn into cancer (precancerous skin lesions or actinic keratoses). Some skin cancers. Cryosurgery usually takes a few minutes, and it can be done in your health care provider's office. Tell a health care provider about: Any allergies you have. All medicines you are taking, including vitamins, herbs, eye drops, creams, and over-the-counter medicines. Any problems you or family members have had with anesthesia. Any bleeding problems you have. Any surgeries you have had. Any medical conditions you have. Whether you are pregnant or may be pregnant. What are the risks? Your provider will talk with you about risks. These may include: Infection. Bleeding. Scars. Changes in skin color. These may include skin that is lighter or darker than it was before it was treated. Swelling. Hair loss in the treated area. Damage to nearby structures or organs, such as nerve damage and loss of feeling. This is rare. What happens before the procedure? Your provider will describe the procedure and discuss the benefits and risks of the procedure with you. What  happens during the procedure?  Your treatment will be done in one of these two ways: Your provider may apply a device (probe) to the skin. The probe has liquid nitrogen flowing through it to cool it down. The probe will be applied to the skin until the growth is frozen and destroyed. Your provider may apply liquid nitrogen to the skin until the growth is frozen and destroyed with: A swab. A spray. The treated area may be covered with a bandage (dressing). The procedure may vary among providers and hospitals. What happens after the procedure? Your blood pressure, heart rate, breathing rate, and blood oxygen level may be monitored until you leave the hospital or clinic. You may have a mild stinging or burning feeling in the area that was treated. This information is not intended to replace advice given to you by your health care provider. Make sure you discuss any questions you have with your health care provider. Document Revised: 04/23/2022 Document Reviewed: 11/27/2021 Elsevier Patient Education  2024 [employer].

## 2023-05-05 NOTE — Progress Notes (Signed)
Subjective  Chief Complaint  Patient presents with   Annual Exam    Pt here for Annual Exam and is not currently fasting     HPI: Christine Crawford is a 53 y.o. female who presents to [facility] [place] today for a Female Wellness Visit. She also has the concerns and/or needs as listed above in the chief complaint. These will be addressed in addition to the Health Maintenance Visit.   Wellness Visit: annual visit with health maintenance review and exam  HM: screens current. Sees gyn. Postmenopause with mild sxs: irritability/worry and some hot flushes. All manageable. Imms up to date. Exercises regularly, weights, tennis, pilates.  Chronic disease f/u and/or acute problem visit: (deemed necessary to be done in addition to the wellness visit): Due for recheck thyroid. No sxs.  C/o ear fullness worse on right. H/o cerumen impaction. No pain Lesion on left 3rd toe x 2 years, itches at times. Non spreading. No ulceration  Assessment  1. Annual physical exam   2. Subclinical hypothyroidism   3. Family history of colon cancer in mother   4. Skin lesion of foot   5. Bilateral impacted cerumen      Plan  Female Wellness Visit: Age appropriate Health Maintenance and Prevention measures were discussed with patient. Included topics are cancer screening recommendations, ways to keep healthy (see AVS) including dietary and exercise recommendations, regular eye and dental care, use of seat belts, and avoidance of moderate alcohol use and tobacco use.  BMI: discussed patient's BMI and encouraged positive lifestyle modifications to help get to or maintain a target BMI. HM needs and immunizations were addressed and ordered. See below for orders. See HM and immunization section for updates. Routine labs and screening tests ordered including cmp, cbc and lipids where appropriate. Discussed recommendations regarding Vit D and calcium supplementation (see AVS)  Chronic disease  management visit and/or acute problem visit: S/p cryotherapy to wart like skin lesion. Routine care instructions given. Return for treatment x 2 if needed Cerumen impaction resolved.   Follow up: 12 mo for cpe  Orders Placed This Encounter  Procedures   CBC with Differential/Platelet   Comprehensive metabolic panel   Lipid panel   T3   T4, free   TSH   No orders of the defined types were placed in this encounter.     Body mass index is 25.99 kg/m. Wt Readings from Last 3 Encounters:  05/05/23 151 lb 6.4 oz (68.7 kg)  06/16/22 148 lb (67.1 kg)  04/29/22 151 lb 3.2 oz (68.6 kg)     Patient Active Problem List   Diagnosis Date Noted Date Diagnosed   Family history of colon cancer in mother 04/24/2020     Priority: High    Deceased age 55 q 5 year colonoscopy: Dr Orvan Falconer, 07/2020: tics and melanosis. 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     Priority: Low   Health Maintenance  Topic Date Due   COVID-19 Vaccine (4 - 2024-25 season) 05/21/2023 (Originally 02/21/2023)   MAMMOGRAM  01/09/2024   Colonoscopy  08/02/2025   Cervical Cancer Screening (  HPV/Pap Cotest)  04/29/2026   DTaP/Tdap/Td (3 - Td or Tdap) 04/24/2030   INFLUENZA VACCINE  Completed   Hepatitis C Screening  Completed   Zoster Vaccines- Shingrix  Completed   HPV VACCINES  Aged Out   HIV Screening  Discontinued   Immunization History  Administered Date(s) Administered   Influenza, Seasonal, Injecte, Preservative Fre 03/31/2010, 01/22/2011   Influenza,inj,Quad PF,6+ Mos 01/13/2013, 02/02/2017, 01/06/2018, 12/28/2018, 02/05/2020, 02/09/2022, 12/27/2022   Influenza,inj,quad, With Preservative 02/02/2014, 02/19/2015,  12/05/2015   Influenza-Unspecified 02/19/2012, 12/25/2020   PFIZER(Purple Top)SARS-COV-2 Vaccination 06/10/2019, 07/01/2019   Pfizer Covid-19 Vaccine Bivalent Booster 41yrs & up 12/27/2022   Tdap 03/31/2010, 04/24/2020   Zoster Recombinant(Shingrix) 12/25/2020, 02/26/2021   We updated and reviewed the patient's past history in detail and it is documented below. Allergies: Patient has no known allergies. 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 Past Surgical History Patient  has a past surgical history that includes Wisdom tooth extraction. Family History: Patient family history includes Breast cancer in her maternal aunt and paternal aunt; Colon cancer (age of onset: 62) in her mother; Healthy in her daughter, father, sister, and son; Hyperlipidemia in her brother; Stroke in an other family member. Social History:  Patient  reports that she has never smoked. She has never used smokeless tobacco. She reports current alcohol use of about 1.0 standard drink of alcohol per week. She reports that she does not use drugs.  Review of Systems: Constitutional: negative for fever or malaise Ophthalmic: negative for photophobia, double vision or loss of vision Cardiovascular: negative for chest pain, dyspnea on exertion, or new LE swelling Respiratory: negative for SOB or persistent cough Gastrointestinal: negative for abdominal pain, change in bowel habits or melena Genitourinary: negative for dysuria or gross hematuria, no abnormal uterine bleeding or disharge Musculoskeletal: negative for new gait disturbance or muscular weakness Integumentary: negative for new or persistent rashes, no breast lumps Neurological: negative for TIA or stroke symptoms Psychiatric: negative for SI or delusions Allergic/Immunologic: negative for hives  Patient Care Team    Relationship Specialty Notifications  Start End  Willow Ora, MD PCP - General Family Medicine  04/24/20   Cliffton Asters, MD (Inactive) Consulting Physician Infectious Diseases  04/24/20   Patton Salles, MD Consulting Physician Obstetrics and Gynecology  04/24/20     Objective  Vitals: BP 120/78   Pulse 61   Temp 98.7 F (37.1 C)   Ht 5\' 4"  (1.626 m)   Wt 151 lb 6.4 oz (68.7 kg)   LMP 01/08/2020 (Approximate)   SpO2 99%   BMI 25.99 kg/m  General:  Well developed, well nourished, no acute distress  Psych:  Alert and orientedx3,normal mood and affect HEENT:  Normocephalic, atraumatic, non-icteric sclera,  supple neck without adenopathy, mass or thyromegaly Cardiovascular:  Normal S1, S2, RRR without gallop, rub or murmur Respiratory:  Good breath sounds bilaterally, CTAB with normal respiratory effort Gastrointestinal: normal bowel sounds, soft, non-tender, no noted masses. No HSM MSK: extremities without edema, joints without erythema or swelling Ext: left 3rd toe with 3-14mm pink raised lesion, dome shaped Neurologic:    Mental status is normal.  Gross motor and sensory exams are normal.  No tremor  Cryotherapy Procedure Note  Pre-operative Diagnosis: benign skin lesion  Post-operative Diagnosis: same  Locations: left 3rd toe  Indications: sxs: itching  Anesthesia: none  Procedure Details   Patient informed of risks (permanent  scarring, infection, light or dark discoloration, bleeding, infection, weakness, numbness and recurrence of the lesion) and benefits of the procedure and verbal informed consent obtained. Universal time out performed  The areas are treated with liquid nitrogen therapy, frozen until ice ball extended 2 mm beyond lesion, allowed to thaw, and treated again. The patient tolerated procedure well.  The patient was instructed on post-op care, warned that there may be blister formation, redness and pain. Recommend OTC analgesia as needed for  pain.  Condition: Stable  Complications: none.   Commons side effects, risks, benefits, and alternatives for medications and treatment plan prescribed today were discussed, and the patient expressed understanding of the given instructions. Patient is instructed to call or message via MyChart if he/she has any questions or concerns regarding our treatment plan. No barriers to understanding were identified. We discussed Red Flag symptoms and signs in detail. Patient expressed understanding regarding what to do in case of urgent or emergency type symptoms.  Medication list was reconciled, printed and provided to the patient in AVS. Patient instructions and summary information was reviewed with the patient as documented in the AVS. This note was prepared with assistance of Dragon voice recognition software. Occasional wrong-word or sound-a-like substitutions may have occurred due to the inherent limitations of voice recognition software

## 2023-05-06 ENCOUNTER — Encounter: Payer: Self-pay | Admitting: Family Medicine

## 2023-05-06 LAB — T3: T3, Total: 105 ng/dL (ref 76–181)

## 2023-05-06 NOTE — Progress Notes (Signed)
Labs reviewed.  The 10-year ASCVD risk score (Arnett DK, et al., 2019) is: 1.1%   Values used to calculate the score:     Age: 53 years     Sex: Female     Is Non-Hispanic African American: No     Diabetic: No     Tobacco smoker: No     Systolic Blood Pressure: 120 mmHg     Is BP treated: No     HDL Cholesterol: 87 mg/dL     Total Cholesterol: 255 mg/dL

## 2023-07-08 ENCOUNTER — Other Ambulatory Visit (INDEPENDENT_AMBULATORY_CARE_PROVIDER_SITE_OTHER)

## 2023-07-08 ENCOUNTER — Other Ambulatory Visit: Payer: Self-pay

## 2023-07-08 ENCOUNTER — Encounter: Payer: Self-pay | Admitting: Physician Assistant

## 2023-07-08 ENCOUNTER — Ambulatory Visit (INDEPENDENT_AMBULATORY_CARE_PROVIDER_SITE_OTHER): Admitting: Physician Assistant

## 2023-07-08 DIAGNOSIS — M542 Cervicalgia: Secondary | ICD-10-CM | POA: Diagnosis not present

## 2023-07-08 DIAGNOSIS — M25511 Pain in right shoulder: Secondary | ICD-10-CM

## 2023-07-08 DIAGNOSIS — G8929 Other chronic pain: Secondary | ICD-10-CM | POA: Diagnosis not present

## 2023-07-08 MED ORDER — METHYLPREDNISOLONE 4 MG PO TBPK: ORAL_TABLET | ORAL | 0 refills | Status: DC

## 2023-07-08 MED ORDER — METHOCARBAMOL 500 MG PO TABS: 500.0000 mg | ORAL_TABLET | Freq: Four times a day (QID) | ORAL | 0 refills | Status: DC | PRN

## 2023-07-08 NOTE — Progress Notes (Signed)
 Office Visit Note   Patient: Christine Crawford           Date of Birth: 1971/01/13           MRN: [id number] Visit Date: 07/08/2023              Requested by: Willow Ora, MD [address] [place],  [place] [address] PCP: Willow Ora, MD   Assessment & Plan: Visit Diagnoses:  1. Neck pain   2. Chronic right shoulder pain     Plan: Patient is a pleasant 54 year old woman who presents today with neck pain radiating into her shoulder and axilla.  She said this occurred after playing tennis.  She does have a history of tennis elbow but noticed after playing tennis that she had pain in her neck initially and then migrated down to her right arm.  She does feel she has some altered sensation as well.  X-rays exam more consistent with neck strain than shoulder issues.  She is going out of town and I recommended stopping anti-inflammatories and I will place her on a Medrol Dosepak.  Discussed how to take this with her with food.  Also gave her some Robaxin for the spasm.  She will contact me in a week if she is not better could consider some formal physical therapy  Follow-Up Instructions: Return if symptoms worsen or fail to improve.   Orders:  Orders Placed This Encounter  Procedures   XR Shoulder Right   XR Cervical Spine 2 or 3 views   Meds ordered this encounter  Medications   methylPREDNISolone (MEDROL DOSEPAK) 4 MG TBPK tablet    Sig: Take as directed with food    Dispense:  21 tablet    Refill:  0   methocarbamol (ROBAXIN) 500 MG tablet    Sig: Take 1 tablet (500 mg total) by mouth every 6 (six) hours as needed for muscle spasms.    Dispense:  30 tablet    Refill:  0      Procedures: No procedures performed   Clinical Data: No additional findings.   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 She states that the shoulder kind of feels like a  restless arm and painful    Neck - Pain    Patient in today with pain in the right side of the neck that radiates to the shoulder.  She had ROM but over head is with pain     HPI Pleasant 53 year old woman comes in today complaining of neck pain that occurred after playing tennis this is gotten worse over the last few days.  Denies any previous history does radiate down into her shoulder a little bit but started in her neck Review of Systems  All other systems reviewed and are negative.    Objective: Vital Signs: LMP 01/08/2020 (Approximate)   Physical Exam Constitutional:      Appearance: Normal appearance.  Pulmonary:     Effort: Pulmonary effort is normal.  Neurological:     General: No focal deficit present.     Mental Status: She is alert and oriented to person, place, and time.  Psychiatric:        Mood and Affect: Mood normal.        Behavior: Behavior normal.    Ortho Exam Examination of her shoulder she has full forward  elevation internal rotation behind her back she has excellent strength with resisted abduction external and internal rotation.  Negative empty can test negative speeds test.  She does have some pain and soreness reproduced with range of motion of her neck.  This is mildly limited.  Grip strength is intact against her strength is intact Specialty Comments:  No specialty comments available.  Imaging: XR Shoulder Right Result Date: 07/08/2023 Radiographs of her right shoulder well-preserved joint spacing.  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   ID: treating with weekly diflucan      Family History  Problem Relation Age of Onset   Colon cancer Mother 55   Healthy Father    Breast cancer Maternal Aunt    Breast cancer Paternal Aunt    Healthy Sister    Hyperlipidemia Brother    Healthy Daughter    Healthy Son    Stroke Other    Depression Neg Hx    Diabetes Neg Hx    Dementia Neg Hx    Colon polyps Neg Hx    Rectal cancer Neg Hx    Stomach cancer Neg Hx     Past Surgical History:  Procedure Laterality Date   WISDOM TOOTH EXTRACTION     Social History   Occupational History   Not on file  Tobacco Use   Smoking status: Never   Smokeless tobacco: Never  Vaping Use   Vaping status: Never Used  Substance and Sexual Activity   Alcohol use: Yes    Alcohol/week: 1.0 standard drink of alcohol    Types: 1 Glasses of wine per week   Drug use: No   Sexual activity: Yes    Partners: Male    Comment: husband's vasectomy

## 2023-09-17 ENCOUNTER — Ambulatory Visit: Admitting: Physician Assistant

## 2024-01-07 ENCOUNTER — Other Ambulatory Visit: Payer: Self-pay | Admitting: Medical Genetics

## 2024-01-12 ENCOUNTER — Other Ambulatory Visit (HOSPITAL_BASED_OUTPATIENT_CLINIC_OR_DEPARTMENT_OTHER): Payer: Self-pay | Admitting: Family Medicine

## 2024-01-12 DIAGNOSIS — Z1231 Encounter for screening mammogram for malignant neoplasm of breast: Secondary | ICD-10-CM

## 2024-01-14 ENCOUNTER — Ambulatory Visit (HOSPITAL_BASED_OUTPATIENT_CLINIC_OR_DEPARTMENT_OTHER)
Admission: RE | Admit: 2024-01-14 | Discharge: 2024-01-14 | Disposition: A | Discharge status: Home / Self Care | Source: Ambulatory Visit | Attending: Family Medicine | Admitting: Family Medicine

## 2024-01-14 DIAGNOSIS — Z1231 Encounter for screening mammogram for malignant neoplasm of breast: Secondary | ICD-10-CM | POA: Diagnosis present

## 2024-02-01 ENCOUNTER — Other Ambulatory Visit: Payer: Self-pay

## 2024-02-01 DIAGNOSIS — Z006 Encounter for examination for normal comparison and control in clinical research program: Secondary | ICD-10-CM

## 2024-02-14 ENCOUNTER — Encounter: Payer: Self-pay | Admitting: Radiology

## 2024-02-16 ENCOUNTER — Encounter: Payer: Self-pay | Admitting: Family Medicine

## 2024-02-16 ENCOUNTER — Ambulatory Visit (INDEPENDENT_AMBULATORY_CARE_PROVIDER_SITE_OTHER): Admitting: Family Medicine

## 2024-02-16 VITALS — BP 126/84 | HR 53 | Temp 97.7°F | Ht 64.0 in | Wt 150.6 lb

## 2024-02-16 DIAGNOSIS — H6123 Impacted cerumen, bilateral: Secondary | ICD-10-CM

## 2024-02-16 DIAGNOSIS — L818 Other specified disorders of pigmentation: Secondary | ICD-10-CM | POA: Diagnosis not present

## 2024-02-16 DIAGNOSIS — Z23 Encounter for immunization: Secondary | ICD-10-CM | POA: Diagnosis not present

## 2024-02-16 DIAGNOSIS — Z780 Asymptomatic menopausal state: Secondary | ICD-10-CM | POA: Insufficient documentation

## 2024-02-16 DIAGNOSIS — J301 Allergic rhinitis due to pollen: Secondary | ICD-10-CM | POA: Insufficient documentation

## 2024-02-16 DIAGNOSIS — H6993 Unspecified Eustachian tube disorder, bilateral: Secondary | ICD-10-CM

## 2024-02-16 MED ORDER — FLUTICASONE PROPIONATE 50 MCG/ACT NA SUSP: 1.0000 | Freq: Every day | NASAL | 6 refills | Status: AC

## 2024-02-16 NOTE — Patient Instructions (Addendum)
 Please follow up as scheduled for your next visit with me: 05/09/2024 for cpe  If you have any questions or concerns, please don't hesitate to send me a message via MyChart or call the office at [phone]. Thank you for visiting with us  today! It's our pleasure caring for you.    VISIT SUMMARY: During your visit, we addressed your concerns about a new skin spot, persistent throat irritation, and weight management. We also discussed general health maintenance, including the flu vaccination.  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 To prevent further sun exposure, please use sunscreen regularly.  -ALLERGIC RHINITIS WITH EUSTACHIAN TUBE DYSFUNCTION: This condition involves chronic postnasal drainage causing cough and ear fullness. You are prescribed Zyrtec for one month and Flonase nasal spray, one spray in each nostril daily. Proper nasal spray technique was discussed to avoid throat irritation.  -IMPACTED CERUMEN, BILATERAL: This refers to ear wax buildup causing ear fullness and pressure. Your ears were flushed to remove the impacted cerumen.  -GENERAL HEALTH MAINTENANCE: We discussed the importance of routine health maintenance, including the flu vaccination, which you received today.  INSTRUCTIONS: Please follow up in three months to assess your progress with weight management and metabolic health. Continue using sunscreen, taking your prescribed medications, and maintaining a healthy diet and exercise routine.                      Contains text generated by [employer].                                 Contains text generated by [employer].

## 2024-02-16 NOTE — Progress Notes (Signed)
 Subjective  CC:  Chief Complaint  Patient presents with   spot on rt arm   Nasal Congestion    HPI: Christine Crawford is a 53 y.o. female who presents to the office today to address the problems listed above in the chief complaint. Discussed the use of AI scribe software for clinical note transcription with the patient, who gave verbal consent to proceed.  History of Present Illness Christine Crawford is a 53 year old female who presents with concerns about a skin spot, persistent throat irritation, and weight management.  Cutaneous lesion - Light-skinned female with a new spot on her arm, first noticed approximately two weeks ago - No similar lesions on her legs - Lesion is asymptomatic without discomfort  Chronic throat irritation and upper respiratory symptoms - Persistent throat irritation and urge to cough for several months - Occasional sneezing without significant additional symptoms - No improvement with two-week trial of generic cetirizine (Zyrtec) - Ears feel clogged, especially in the mornings, with improvement upon standing  Weight management concerns - Dissatisfaction with current weight, estimated at 68-69 kg - Constant craving for 'yellow' foods - Aware of borderline elevated blood glucose levels - Desires approximately 10% weight reduction   Assessment  1. Seasonal allergic rhinitis due to pollen   2. Need for influenza vaccination   3. Idiopathic guttate hypomelanosis   4. Bilateral impacted cerumen   5. ETD (Eustachian tube dysfunction), bilateral   6. Postmenopausal      Plan  Assessment and Plan Assessment & Plan Idiopathic guttate hypomelanosis Benign skin condition characterized by small white spots, likely due to sun exposure. Not bothersome but concerning due to prevalence of skin conditions. - Use sunscreen to prevent further sun exposure.  Allergic rhinitis with Eustachian tube dysfunction Chronic postnasal drainage causing cough and  Eustachian tube dysfunction with ear fullness and pressure. Symptoms present for a few months. Previous short course of generic Zyrtec was ineffective. - Prescribed Zyrtec for one month. - Prescribed Flonase nasal spray, one spray in each nostril daily. - Educated on proper nasal spray technique to avoid throat irritation.  Impacted cerumen, bilateral Bilateral ear wax buildup contributing to ear fullness and pressure. Not completely impacted but close to it. - Flushed ears to remove impacted cerumen.  Just Overweight and metabolic dysfunction with history of borderline elevated A1c. postmenopausal Concerns about weight and metabolic health. Discussed GLP-1 agonists for weight management and metabolic health. Benefits include weight loss, improved metabolic function, and potential cognitive benefits. Discussed potential side effects such as nausea, fatigue, headache, and constipation. Emphasized importance of nutrition and exercise, including protein intake and weight training.  General Health Maintenance Discussed flu vaccination as part of routine health maintenance. - Administered flu shot.    Follow up: jan for cpe Orders Placed This Encounter  Procedures   Flu vaccine trivalent PF, 6mos and older(Flulaval,Afluria,Fluarix,Fluzone)   Meds ordered this encounter  Medications   fluticasone (FLONASE) 50 MCG/ACT nasal spray    Sig: Place 1 spray into both nostrils daily.    Dispense:  16 g    Refill:  6     I reviewed the patients updated PMH, FH, and SocHx.  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   valACYclovir  (VALTREX ) 500 MG tablet Take 4 tablets (2,000 mg total) by mouth in the morning and at bedtime. As needed for cold sore   Allergies: Patient has no known allergies. Family History: Patient family history includes Breast cancer in her maternal aunt and paternal aunt; Colon cancer (age of onset: 25) in her mother; Healthy in her daughter, father, sister, and son; Hyperlipidemia in her brother; Stroke in an other family member. Social History:  Patient  reports that she has never smoked. She has never used smokeless tobacco. She reports current alcohol use of about 1.0 standard drink of alcohol per week. She reports that she does not use drugs.  Review of Systems: Constitutional: Negative for fever malaise or anorexia Cardiovascular: negative for chest pain Respiratory: negative for SOB or persistent cough Gastrointestinal: negative for abdominal pain  Objective  Vitals: BP 126/84   Pulse (!) 53   Temp 97.7 F (36.5 C)   Ht 5' 4 (1.626 m)   Wt 150 lb 9.6 oz (68.3 kg)   LMP 01/08/2020 (Approximate)   SpO2 99%   BMI 25.85 kg/m  General: no acute distress , A&Ox3 HEENT: PEERL, conjunctiva normal, neck is supple, bilateral cerumen impaction, PND Cardiovascular:  RRR without murmur or gallop.  Respiratory:  Good breath sounds bilaterally, CTAB with normal respiratory effort Skin:  Warm, hypopigmented small annular lesions on arms  Commons side effects, risks, benefits, and alternatives for medications and treatment plan prescribed today were discussed, and the patient expressed understanding of the given instructions. Patient is instructed to call or message via MyChart if  he/she has any questions or concerns regarding our treatment plan. No barriers to understanding were identified. We discussed Red Flag symptoms and signs in detail. Patient expressed understanding regarding what to do in case of urgent or emergency type symptoms.  Medication list was reconciled, printed and provided to the patient in AVS. Patient instructions and summary information was reviewed with the patient as documented in the AVS. This note was prepared with assistance of Dragon voice recognition software. Occasional wrong-word or sound-a-like substitutions may have occurred due to the inherent limitations of voice recognition software

## 2024-02-18 LAB — GENECONNECT MOLECULAR SCREEN: Genetic Analysis Overall Interpretation: NEGATIVE

## 2024-05-08 ENCOUNTER — Encounter: Payer: No Typology Code available for payment source | Admitting: Family Medicine

## 2024-05-09 ENCOUNTER — Encounter: Admitting: Family Medicine

## 2024-05-11 ENCOUNTER — Ambulatory Visit: Admitting: Family Medicine

## 2024-05-11 ENCOUNTER — Encounter: Payer: Self-pay | Admitting: Family Medicine

## 2024-05-11 VITALS — BP 100/76 | HR 61 | Temp 97.7°F | Ht 64.0 in | Wt 141.2 lb

## 2024-05-11 DIAGNOSIS — Z803 Family history of malignant neoplasm of breast: Secondary | ICD-10-CM | POA: Diagnosis not present

## 2024-05-11 DIAGNOSIS — Z23 Encounter for immunization: Secondary | ICD-10-CM

## 2024-05-11 DIAGNOSIS — E038 Other specified hypothyroidism: Secondary | ICD-10-CM

## 2024-05-11 DIAGNOSIS — Z0000 Encounter for general adult medical examination without abnormal findings: Secondary | ICD-10-CM | POA: Diagnosis not present

## 2024-05-11 LAB — CBC WITH DIFFERENTIAL/PLATELET
Basophils Absolute: 0 10*3/uL (ref 0.0–0.1)
Basophils Relative: 0.6 % (ref 0.0–3.0)
Eosinophils Absolute: 0.1 10*3/uL (ref 0.0–0.7)
Eosinophils Relative: 2.1 % (ref 0.0–5.0)
HCT: 37.5 % (ref 36.0–46.0)
Hemoglobin: 12.2 g/dL (ref 12.0–15.0)
Lymphocytes Relative: 35.5 % (ref 12.0–46.0)
Lymphs Abs: 2 10*3/uL (ref 0.7–4.0)
MCHC: 32.6 g/dL (ref 30.0–36.0)
MCV: 83.9 fl (ref 78.0–100.0)
Monocytes Absolute: 0.4 10*3/uL (ref 0.1–1.0)
Monocytes Relative: 7.1 % (ref 3.0–12.0)
Neutro Abs: 3.1 10*3/uL (ref 1.4–7.7)
Neutrophils Relative %: 54.7 % (ref 43.0–77.0)
Platelets: 285 10*3/uL (ref 150.0–400.0)
RBC: 4.47 Mil/uL (ref 3.87–5.11)
RDW: 17.4 % — ABNORMAL HIGH (ref 11.5–15.5)
WBC: 5.7 10*3/uL (ref 4.0–10.5)

## 2024-05-11 LAB — COMPREHENSIVE METABOLIC PANEL WITH GFR
ALT: 10 U/L (ref 3–35)
AST: 19 U/L (ref 5–37)
Albumin: 4.2 g/dL (ref 3.5–5.2)
Alkaline Phosphatase: 58 U/L (ref 39–117)
BUN: 15 mg/dL (ref 6–23)
CO2: 31 meq/L (ref 19–32)
Calcium: 9.4 mg/dL (ref 8.4–10.5)
Chloride: 106 meq/L (ref 96–112)
Creatinine, Ser: 0.84 mg/dL (ref 0.40–1.20)
GFR: 79.05 mL/min
Glucose, Bld: 81 mg/dL (ref 70–99)
Potassium: 4.6 meq/L (ref 3.5–5.1)
Sodium: 140 meq/L (ref 135–145)
Total Bilirubin: 0.5 mg/dL (ref 0.2–1.2)
Total Protein: 6.5 g/dL (ref 6.0–8.3)

## 2024-05-11 LAB — TSH: TSH: 5.07 u[IU]/mL (ref 0.35–5.50)

## 2024-05-11 LAB — LIPID PANEL
Cholesterol: 252 mg/dL — ABNORMAL HIGH (ref 28–200)
HDL: 73.8 mg/dL
LDL Cholesterol: 165 mg/dL — ABNORMAL HIGH (ref 10–99)
NonHDL: 178.21
Total CHOL/HDL Ratio: 3
Triglycerides: 66 mg/dL (ref 10.0–149.0)
VLDL: 13.2 mg/dL (ref 0.0–40.0)

## 2024-05-11 LAB — T3, FREE: T3, Free: 3.1 pg/mL (ref 2.3–4.2)

## 2024-05-11 LAB — VITAMIN D 25 HYDROXY (VIT D DEFICIENCY, FRACTURES): VITD: 27.88 ng/mL — ABNORMAL LOW (ref 30.00–100.00)

## 2024-05-11 LAB — T4, FREE: Free T4: 0.54 ng/dL — ABNORMAL LOW (ref 0.60–1.60)

## 2024-05-11 NOTE — Patient Instructions (Signed)
 Please return in 12 months for your annual complete physical; please come fasting.   I will release your lab results to you on your MyChart account with further instructions. You may see the results before I do, but when I review them I will send you a message with my report or have my assistant call you if things need to be discussed. Please reply to my message with any questions. Thank you!   If you have any questions or concerns, please don't hesitate to send me a message via MyChart or call the office at [phone]. Thank you for visiting with us  today! It's our pleasure caring for you.

## 2024-05-11 NOTE — Progress Notes (Signed)
 " Subjective  Chief Complaint  Patient presents with   Annual Exam    Pt here for Annual Exam and is currently fasting     HPI: Christine Crawford is a 54 y.o. female who presents to [facility] [place] today for a Female Wellness Visit. She also has the concerns and/or needs as listed above in the chief complaint. These will be addressed in addition to the Health Maintenance Visit.   Wellness Visit: annual visit with health maintenance review and exam  HM: mammo, crc and pap are all current and normal.  Doing very well.  Happy and healthy.  Works full-time.  Exercises regularly.  Gym and tennis.  Diet has improved.  Weight is trending down with the help of tirzepatide.  Feels great.  Mild constipation and occlusion along right lower quadrant pain that is mild and fleeting.  No associated fevers chills or significant bowel changes.  Will increase fruit, papaya specifically to help with constipation.  No melena. History of mild subclinical hypothyroidism without any symptoms of thyroid disorder.  She is fasting today for lab work Eligible for Prevnar 20. Postmenopausal about problematic symptoms.     Assessment  1. Annual physical exam   2. Need for pneumococcal 20-valent conjugate vaccination   3. Family history of breast cancer   4. Subclinical hypothyroidism      Plan  Female Wellness Visit: Age appropriate Health Maintenance and Prevention measures were discussed with patient. Included topics are cancer screening recommendations, ways to keep healthy (see AVS) including dietary and exercise recommendations, regular eye and dental care, use of seat belts, and avoidance of moderate alcohol use and tobacco use.  Screens are current BMI: discussed patient's BMI and encouraged positive lifestyle modifications to help get to or maintain a target BMI. HM needs and immunizations were addressed and ordered. See below for orders. See HM and immunization section for  updates. Routine labs and screening tests ordered including cmp, cbc and lipids where appropriate. Discussed recommendations regarding Vit D and calcium supplementation (see AVS) Weight management: Almost at ideal weight.  Blood pressure is perfect.  Continue tirzepatide for weight loss.  She would like to lose a few more pounds.  Discussed good nutrition and exercise. Check TSH, free T3 and free T4.  Clinically stable.    Follow up: 1 year for complete physical Orders Placed This Encounter  Procedures   Pneumococcal conjugate vaccine 20-valent (Prevnar 20)   VITAMIN D 25 Hydroxy (Vit-D Deficiency, Fractures)   CBC with Differential/Platelet   Comprehensive metabolic panel with GFR   Lipid panel   TSH   T3, free   T4, free   No orders of the defined types were placed in this encounter.     Body mass index is 24.24 kg/m. Wt Readings from Last 3 Encounters:  05/11/24 141 lb 3.2 oz (64 kg)  02/16/24 150 lb 9.6 oz (68.3 kg)  05/05/23 151 lb 6.4 oz (68.7 kg)     Patient Active Problem List   Diagnosis Date Noted Date Diagnosed   Family history of colon cancer in mother 04/24/2020     Priority: High    Deceased age 34 q 5 year colonoscopy: Dr Eda, 07/2020: tics and melanosis. No polyps    Postmenopausal 02/16/2024     Priority: Medium    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/30/2020     Priority: Medium     04/2020    Family history of breast cancer 04/24/2020  Priority: Medium    Menopausal hot flushes 04/24/2020     Priority: Medium    Seasonal allergic rhinitis due to pollen 02/16/2024     Priority: Low   Idiopathic guttate hypomelanosis 02/16/2024     Priority: Low   Varicose veins of bilateral lower extremities with pain 04/24/2020     Priority: Low   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     Priority: Low   Health Maintenance  Topic Date Due   Hepatitis B Vaccines 19-59 Average Risk (1 of 3 - 19+ 3-dose series) Never done   COVID-19 Vaccine (4 - 2025-26 season)  05/27/2024 (Originally 12/13/2023)   Mammogram  01/13/2025   Colonoscopy  08/02/2025   Cervical Cancer Screening (HPV/Pap Cotest)  04/29/2026   DTaP/Tdap/Td (3 - Td or Tdap) 04/24/2030   Pneumococcal Vaccine: 50+ Years  Completed   Influenza Vaccine  Completed   HPV VACCINES (No Doses Required) Completed   Hepatitis C Screening  Completed   Zoster Vaccines- Shingrix  Completed   Meningococcal B Vaccine  Aged Out   HIV Screening  Discontinued   Immunization History  Administered Date(s) Administered   Influenza, Seasonal, Injecte, Preservative Fre 03/31/2010, 01/22/2011, 02/16/2024   Influenza,inj,Quad PF,6+ Mos 01/13/2013, 02/02/2017, 01/06/2018, 12/28/2018, 02/05/2020, 02/09/2022, 12/27/2022   Influenza,inj,quad, With Preservative 02/02/2014, 02/19/2015, 12/05/2015   Influenza-Unspecified 02/19/2012, 12/25/2020   PFIZER(Purple Top)SARS-COV-2 Vaccination 06/10/2019, 07/01/2019   PNEUMOCOCCAL CONJUGATE-20 05/11/2024   Pfizer Covid-19 Vaccine Bivalent Booster 50yrs & up 12/27/2022   Tdap 03/31/2010, 04/24/2020   Zoster Recombinant(Shingrix) 12/25/2020, 02/26/2021   We updated and reviewed the patient's past history in detail and it is documented below. Allergies: Patient has no known allergies. 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 Past Surgical History Patient  has a past surgical history that includes Wisdom tooth extraction. Family History: Patient family history includes Breast cancer in her maternal aunt and paternal aunt; Colon cancer (age of onset: 64) in her mother; Healthy in her daughter, father, sister, and son; Hyperlipidemia in her brother; Stroke in an other family member. Social History:  Patient  reports that she has never smoked. She has never used smokeless tobacco. She reports current alcohol use of about 1.0 standard drink of alcohol per week. She reports that  she does not use drugs.  Review of Systems: Constitutional: negative for fever or malaise Ophthalmic: negative for photophobia, double vision or loss of vision Cardiovascular: negative for chest pain, dyspnea on exertion, or new LE swelling Respiratory: negative for SOB or persistent cough Gastrointestinal: negative for abdominal pain, change in bowel habits or melena Genitourinary: negative for dysuria or gross hematuria, no abnormal uterine bleeding or disharge Musculoskeletal: negative for new gait disturbance or muscular weakness Integumentary: negative for new or persistent rashes, no breast lumps Neurological: negative for TIA or stroke symptoms Psychiatric: negative for SI or delusions Allergic/Immunologic: negative for hives  Patient Care Team    Relationship Specialty Notifications Start End  Jodie Lavern CROME, MD PCP - General Family Medicine  04/24/20   Cathlyn JAYSON Nikki Bobie FORBES, MD Consulting Physician Obstetrics and Gynecology  04/24/20     Objective  Vitals: BP 100/76   Pulse 61   Temp 97.7 F (36.5 C)   Ht 5' 4 (1.626 m)   Wt 141 lb 3.2 oz (64 kg)   LMP 01/08/2020   SpO2 95%   BMI 24.24 kg/m  General:  Well developed, well nourished, no  acute distress, looks great Psych:  Alert and orientedx3,normal mood and affect HEENT:  Normocephalic, atraumatic, non-icteric sclera,  supple neck without adenopathy, mass or thyromegaly Cardiovascular:  Normal S1, S2, RRR without gallop, rub or murmur Respiratory:  Good breath sounds bilaterally, CTAB with normal respiratory effort Gastrointestinal: normal bowel sounds, soft, non-tender, no noted masses. No HSM MSK: extremities without edema, joints without erythema or swelling Neurologic:    Mental status is normal.  Gross motor and sensory exams are normal.  No tremor  Commons side effects, risks, benefits, and alternatives for medications and treatment plan prescribed today were discussed, and the patient expressed  understanding of the given instructions. Patient is instructed to call or message via MyChart if he/she has any questions or concerns regarding our treatment plan. No barriers to understanding were identified. We discussed Red Flag symptoms and signs in detail. Patient expressed understanding regarding what to do in case of urgent or emergency type symptoms.  Medication list was reconciled, printed and provided to the patient in AVS. Patient instructions and summary information was reviewed with the patient as documented in the AVS. This note was prepared with assistance of Dragon voice recognition software. Occasional wrong-word or sound-a-like substitutions may have occurred due to the inherent limitations of voice recognition software      "

## 2024-05-12 ENCOUNTER — Ambulatory Visit: Payer: Self-pay | Admitting: Family Medicine

## 2024-05-12 NOTE — Progress Notes (Signed)
 See mychart note The 10-year ASCVD risk score (Arnett DK, et al., 2019) is: 1%   Values used to calculate the score:     Age: 54 years     Clinically relevant sex: Female     Is Non-Hispanic African American: No     Diabetic: No     Tobacco smoker: No     Systolic Blood Pressure: 100 mmHg     Is BP treated: No     HDL Cholesterol: 73.8 mg/dL     Total Cholesterol: 252 mg/dL  Christine Crawford, Your lab results look good overall. A few things worth mentioning: -Vitamin D level remains low. Are you taking a good Vit D supplement daily? If yes, then I can send over a high dose weekly supplement to rebuild your levels. If not, then I recommend taking 2000 units daily. Let me know if you'd like a recommendation for a good medical grade formulation.  -Your cholesterol levels remain ok; Your HDL (good cholesterol) is a little lower than before and your LDL (bad cholesterol) is a little higher than before. This may be related to your thyroid and hormone changes.  -Your thyroid function looks like it is declining. Not yet technically hypo or low thyroid but the trend is there. We could consider starting a very low dose thyroid supplement to support you metabolically. It is a daily pill. OR we can recheck in one year and start once TSH is > 10.   Let me know your thoughts.  Thanks! Dr. Jodie   Sincerely, Dr. Jodie

## 2024-05-16 MED ORDER — LEVOTHYROXINE SODIUM 25 MCG PO TABS: 25.0000 ug | ORAL_TABLET | Freq: Every day | ORAL | 3 refills | Status: AC

## 2024-08-14 ENCOUNTER — Ambulatory Visit: Admitting: Family Medicine

## 2024-11-08 ENCOUNTER — Ambulatory Visit: Admitting: Obstetrics and Gynecology

## 2025-05-14 ENCOUNTER — Encounter: Admitting: Family Medicine
